# Patient Record
Sex: Female | Born: 1990 | Race: White | Hispanic: No | Marital: Single | Smoking: Current some day smoker
Health system: Southern US, Community
[De-identification: ages and names within clinical notes are randomized; demographics above are authoritative.]

## PROBLEM LIST (undated history)

## (undated) DIAGNOSIS — L709 Acne, unspecified: Secondary | ICD-10-CM

## (undated) DIAGNOSIS — N912 Amenorrhea, unspecified: Secondary | ICD-10-CM

## (undated) DIAGNOSIS — F329 Major depressive disorder, single episode, unspecified: Secondary | ICD-10-CM

## (undated) DIAGNOSIS — F988 Other specified behavioral and emotional disorders with onset usually occurring in childhood and adolescence: Secondary | ICD-10-CM

## (undated) DIAGNOSIS — F410 Panic disorder [episodic paroxysmal anxiety] without agoraphobia: Secondary | ICD-10-CM

## (undated) DIAGNOSIS — H9319 Tinnitus, unspecified ear: Secondary | ICD-10-CM

## (undated) DIAGNOSIS — T7840XA Allergy, unspecified, initial encounter: Secondary | ICD-10-CM

## (undated) DIAGNOSIS — L509 Urticaria, unspecified: Secondary | ICD-10-CM

## (undated) DIAGNOSIS — G4700 Insomnia, unspecified: Secondary | ICD-10-CM

## (undated) DIAGNOSIS — F419 Anxiety disorder, unspecified: Secondary | ICD-10-CM

## (undated) DIAGNOSIS — E349 Endocrine disorder, unspecified: Secondary | ICD-10-CM

## (undated) HISTORY — DX: Tinnitus, unspecified ear: H93.19

## (undated) HISTORY — DX: Acne, unspecified: L70.9

## (undated) HISTORY — DX: Insomnia, unspecified: G47.00

## (undated) HISTORY — DX: Allergy, unspecified, initial encounter: T78.40XA

## (undated) HISTORY — DX: Amenorrhea, unspecified: N91.2

## (undated) HISTORY — DX: Anxiety disorder, unspecified: F41.9

## (undated) HISTORY — DX: Endocrine disorder, unspecified: E34.9

## (undated) HISTORY — DX: Major depressive disorder, single episode, unspecified: F32.9

## (undated) HISTORY — DX: Other specified behavioral and emotional disorders with onset usually occurring in childhood and adolescence: F98.8

## (undated) HISTORY — DX: Urticaria, unspecified: L50.9

---
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
## 2011-04-24 ENCOUNTER — Ambulatory Visit: Payer: Self-pay | Admitting: Internal Medicine

## 2011-04-24 DIAGNOSIS — J019 Acute sinusitis, unspecified: Secondary | ICD-10-CM

## 2011-04-24 DIAGNOSIS — F988 Other specified behavioral and emotional disorders with onset usually occurring in childhood and adolescence: Secondary | ICD-10-CM

## 2011-05-29 ENCOUNTER — Encounter: Payer: Self-pay | Admitting: Internal Medicine

## 2011-05-29 ENCOUNTER — Ambulatory Visit: Payer: Self-pay | Admitting: Internal Medicine

## 2011-05-29 VITALS — BP 118/80 | HR 98 | Temp 97.3°F | Resp 16 | Ht 67.5 in | Wt 297.3 lb

## 2011-05-29 DIAGNOSIS — F988 Other specified behavioral and emotional disorders with onset usually occurring in childhood and adolescence: Secondary | ICD-10-CM

## 2011-05-29 MED ORDER — METHYLPHENIDATE HCL 20 MG PO TABS: 20.0000 mg | ORAL_TABLET | Freq: Two times a day (BID) | ORAL | Status: DC

## 2011-05-29 NOTE — Progress Notes (Signed)
  Subjective:    Patient ID: Samantha Hernandez, female    DOB: [birth year], 21 y.o.   MRN: [id number]  HPIAdderall worked fairly well at 10 mg and even better at 20 mg but both doses cause nausea for the duration and the activity of the medicine. This did not improve if taken with meals. There were no other side effects    Review of Systems     Objective:   Physical Exam Stable vital signs except obesity      Assessment & Plan:  Problem #1 ADD C. Referral by counselor McKaLovich  Shins she has no insurance our next option will be Ritalin 20 mg #60 to try one twice a day as needed She will call me the results and I'll arrange her next prescription by phone

## 2011-07-03 ENCOUNTER — Ambulatory Visit: Payer: Self-pay | Admitting: Internal Medicine

## 2011-07-15 ENCOUNTER — Telehealth: Payer: Self-pay

## 2011-07-15 DIAGNOSIS — F988 Other specified behavioral and emotional disorders with onset usually occurring in childhood and adolescence: Secondary | ICD-10-CM

## 2011-07-15 NOTE — Telephone Encounter (Signed)
PT REQUESTING RITALIN REFILL    BEST PHONE [phone]

## 2011-07-16 NOTE — Telephone Encounter (Signed)
TL- Please call the patient to clarify which dose she takes 10 mg or 20 mg?  Dr. Merla Riches- When do you want her to RTC for follow-up?

## 2011-07-17 NOTE — Telephone Encounter (Signed)
Patient takes 20 mg bid.

## 2011-07-17 NOTE — Telephone Encounter (Signed)
If you will clarify what does she is which I think is 20 mg of Ritalin twice a day and I will give her 3 months worth of prescriptions and see her again at the end of that

## 2011-07-19 DIAGNOSIS — F988 Other specified behavioral and emotional disorders with onset usually occurring in childhood and adolescence: Secondary | ICD-10-CM | POA: Insufficient documentation

## 2011-07-19 MED ORDER — METHYLPHENIDATE HCL 20 MG PO TABS: 20.0000 mg | ORAL_TABLET | Freq: Two times a day (BID) | ORAL | Status: DC

## 2011-07-19 NOTE — Telephone Encounter (Signed)
Okay as outlined in old chart

## 2011-07-20 NOTE — Telephone Encounter (Signed)
[facility] RX ready to pick up. 

## 2015-02-18 ENCOUNTER — Ambulatory Visit (INDEPENDENT_AMBULATORY_CARE_PROVIDER_SITE_OTHER): Payer: Commercial Managed Care - PPO | Admitting: Internal Medicine

## 2015-02-18 VITALS — BP 118/80 | HR 73 | Temp 98.2°F | Resp 18 | Ht 68.75 in | Wt 235.2 lb

## 2015-02-18 DIAGNOSIS — F909 Attention-deficit hyperactivity disorder, unspecified type: Secondary | ICD-10-CM | POA: Diagnosis not present

## 2015-02-18 DIAGNOSIS — F988 Other specified behavioral and emotional disorders with onset usually occurring in childhood and adolescence: Secondary | ICD-10-CM

## 2015-02-18 MED ORDER — AMPHETAMINE-DEXTROAMPHETAMINE 10 MG PO TABS: 10.0000 mg | ORAL_TABLET | Freq: Two times a day (BID) | ORAL | Status: DC

## 2015-02-18 MED ORDER — AMPHETAMINE-DEXTROAMPHETAMINE 10 MG PO TABS
10.0000 mg | ORAL_TABLET | Freq: Two times a day (BID) | ORAL | Status: DC
Start: 2015-02-18 — End: 2015-08-30

## 2015-02-18 NOTE — Progress Notes (Signed)
   Subjective:    Patient ID: Samantha Hernandez, female    DOB: 04/21/1990, 24 y.o.   MRN: [id number] This chart was scribed for Ellamae Siaobert Elliott Quade, MD by Jolene Provostobert Halas, Medical Scribe. This patient was seen in Room 3 and the patient's care was started a 3:31 PM.  Chief Complaint  Patient presents with  . ADD    Wants to get back on ADD medication.     HPI HPI Comments: Samantha Hernandez is a 24 y.o. female who [facility] complaining of attention difficulty, and difficulty making decisions. She has recently taken on a second job and is having trouble managing both. 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  [facility] the diagnosis of attention deficit disorder. She was treated successfully lost insurance and discontinued medication for the last few years.  Review of Systems  Constitutional: Negative for fever, chills and unexpected weight change.  Psychiatric/Behavioral: Positive for decreased concentration. Negative for behavioral problems. The patient is not nervous/anxious.    note that she has managed to lose 50 pounds through hard work    Objective:  BP 118/80 mmHg  Pulse 73  Temp(Src) 98.2 F (36.8 C) (Oral)  Resp 18  Ht 5' 8.75" (1.746 m)  Wt 235 lb 3.2 oz (106.686 kg)  BMI 35.00 kg/m2  SpO2 98%  LMP 02/04/2015  Physical Exam  Constitutional: She is oriented to person, place, and time. She appears well-developed and well-nourished. No distress.  HENT:  Head: Normocephalic and atraumatic.  Eyes: EOM are normal. Pupils are equal, round, and reactive to light.  Neck: Neck supple. No thyromegaly present.  Cardiovascular: Normal rate, regular rhythm and normal heart sounds.   No murmur heard. Pulmonary/Chest: Effort normal. No respiratory distress.  Musculoskeletal: Normal range of motion.  Neurological: She is alert and oriented to person, place, and time. No cranial nerve deficit.   Skin: Skin is warm and dry. She is not diaphoretic.  Psychiatric: She has a normal mood and affect. Her behavior is normal.  Nursing note and vitals reviewed.     Assessment & Plan:  ADD (attention deficit disorder)  Meds ordered this encounter  Medications  . amphetamine-dextroamphetamine (ADDERALL) 10 MG tablet    Sig: Take 1 tablet (10 mg total) by mouth 2 (two) times daily.    Dispense:  60 tablet    Refill:  0  . amphetamine-dextroamphetamine (ADDERALL) 10 MG tablet    Sig: Take 1 tablet (10 mg total) by mouth 2 (two) times daily. For 30 d after signed    Dispense:  60 tablet    Refill:  0  . amphetamine-dextroamphetamine (ADDERALL) 10 MG tablet    Sig: Take 1 tablet (10 mg total) by mouth 2 (two) times daily. For 60d after signed    Dispense:  60 tablet    Refill:  0  fu 3 mo  I have completed the patient encounter in its entirety as documented by the scribe, with editing by me where necessary. Davon Folta P. Merla Richesoolittle, M.D.   By signing my name below, I, Javier Dockerobert Ryan Halas, attest that this documentation has been prepared under the direction and in the presence of Ellamae Siaobert Carling Liberman, MD. Electronically Signed: Javier Dockerobert Ryan Halas, ER Scribe. 02/18/2015. 3:32 PM.

## 2015-07-13 ENCOUNTER — Ambulatory Visit (INDEPENDENT_AMBULATORY_CARE_PROVIDER_SITE_OTHER): Payer: Commercial Managed Care - PPO | Admitting: Physician Assistant

## 2015-07-13 VITALS — BP 122/80 | HR 77 | Temp 98.0°F | Resp 18 | Ht 68.75 in | Wt 197.4 lb

## 2015-07-13 DIAGNOSIS — H9313 Tinnitus, bilateral: Secondary | ICD-10-CM

## 2015-07-13 DIAGNOSIS — L853 Xerosis cutis: Secondary | ICD-10-CM

## 2015-07-13 DIAGNOSIS — H6123 Impacted cerumen, bilateral: Secondary | ICD-10-CM

## 2015-07-13 MED ORDER — FLUTICASONE PROPIONATE 50 MCG/ACT NA SUSP: 2.0000 | Freq: Every day | NASAL | Status: DC

## 2015-07-13 NOTE — Progress Notes (Signed)
[facility] [address], [place] [id number] [phone]  Date:  07/13/2015   Name:  Samantha CooleyKatie M Hernandez   DOB:  09/03/1990   MRN:  [id number]  PCP:  No primary care provider on file.    Chief Complaint: Cerumen Impaction and Dry Skin   History of Present Illness:  This is a 25 y.o. female with PMH ADD who is presenting with bilateral clogged ears. States she could tell they were getting clogged about a week ago. She bought debrox to clean them out and she thinks that actually made things worse. She has had some decreased hearing over the past 1 week.  Pt complains of bilateral tinnitus. Been going on for 5-6 years. When first started she went to an ENT. Hearing tests were normal. She has not been on any meds consistently that might cause. ENT thought it might be d/t jaw malalignment. She does endorse having allergies. Doesn't take anything for them  She is also complaining of dry skin on her face. She went to see a dermatologist who placed her on tretinoin topical and doxy oral. She states her skin has been very dry since starting this regimen. She states smiling hurts bc of how dry her skin is. She has been using neutrogena lotion twice a day but doesn't think that is helping. She has increased her hydration.  Review of Systems:  Review of Systems See HPI  Patient Active Problem List   Diagnosis Date Noted  . ADD (attention deficit disorder) 07/19/2011    Prior to Admission medications   Medication Sig Start Date End Date Taking? Authorizing Provider  amphetamine-dextroamphetamine (ADDERALL) 10 MG tablet Take 10 mg by mouth 2 (two) times daily.   Yes Historical Provider, MD  amphetamine-dextroamphetamine (ADDERALL) 10 MG tablet Take 1 tablet (10 mg total) by mouth 2 (two) times daily. 02/18/15  Yes Tonye Pearsonobert P Doolittle, MD  amphetamine-dextroamphetamine (ADDERALL) 10 MG tablet Take 1 tablet (10 mg total) by mouth 2 (two) times daily. For 30 d after signed 02/18/15   Yes Tonye Pearsonobert P Doolittle, MD  amphetamine-dextroamphetamine (ADDERALL) 10 MG tablet Take 1 tablet (10 mg total) by mouth 2 (two) times daily. For 60d after signed 02/18/15  Yes Tonye Pearsonobert P Doolittle, MD    No Known Allergies  History reviewed. No pertinent past surgical history.  Social History  Substance Use Topics  . Smoking status: Never Smoker   . Smokeless tobacco: None  . Alcohol Use: None    History reviewed. No pertinent family history.  Medication list has been reviewed and updated.  Physical Examination:  Physical Exam  Constitutional: She is oriented to person, place, and time. She appears well-developed and well-nourished. No distress.  HENT:  Head: Normocephalic and atraumatic.  Right Ear: Hearing, external ear and ear canal normal.  Left Ear: Hearing, external ear and ear canal normal.  Nose: Nose normal.  Bilateral TMs blocked by impacted cerumen After lavage, residual pieces of cerumen removed with alligator forceps. TMs clear.  Eyes: Conjunctivae and lids are normal. Right eye exhibits no discharge. Left eye exhibits no discharge. No scleral icterus.  Pulmonary/Chest: Effort normal. No respiratory distress.  Musculoskeletal: Normal range of motion.  Neurological: She is alert and oriented to person, place, and time.  Skin: Skin is warm, dry and intact.  Acne on chin, skin on chin and lower half of face very dry  Psychiatric: She has a normal mood and affect. Her speech is normal and behavior is normal.  Thought content normal.   BP 122/80 mmHg  Pulse 77  Temp(Src) 98 F (36.7 C) (Oral)  Resp 18  Ht 5' 8.75" (1.746 m)  Wt 197 lb 6.4 oz (89.54 kg)  BMI 29.37 kg/m2  SpO2 98%  LMP 06/22/2015  Assessment and Plan:  1. Tinnitus, bilateral Unknown cause at this time. Possible ETD? Try 1 month trial of flonase and see if helps. She has appt with oral surgeon to assess her jaw alignment. - fluticasone (FLONASE) 50 MCG/ACT nasal spray; Place 2 sprays into both  nostrils daily.  Dispense: 16 g; Refill: 12  2. Cerumen impaction, bilateral Symptoms resolved and canals clear with ear lavage.  3. Dry skin Advised she buy a neutrogena face cream instead of a lotion and apply BID. Counseled on adequate hydration.   Roswell Miners Dyke Brackett, MHS [facility]  07/13/2015

## 2015-07-13 NOTE — Patient Instructions (Addendum)
Try flonase, two sprays in each nostril once a day. Takes 2 weeks to reach maximal effectiveness - give it a month trial to see if helpful May use debrox, mineral oil or hydrogen peroxide once every 2 weeks to help with ear wax. Do not use q-tips EVER  neutrogena night cream twice a day.    IF you received an x-ray today, you will receive an invoice from [facility]. Please contact [facility] at [phone] with questions or concerns regarding your invoice.   IF you received labwork today, you will receive an invoice from [facility]. Please contact [facility] at [phone] with questions or concerns regarding your invoice.   Our billing staff will not be able to assist you with questions regarding bills from these companies.  You will be contacted with the lab results as soon as they are available. The fastest way to get your results is to activate your My Chart account. Instructions are located on the last page of this paperwork. If you have not heard from us regarding the results in 2 weeks, please contact this office.

## 2015-08-30 ENCOUNTER — Ambulatory Visit (INDEPENDENT_AMBULATORY_CARE_PROVIDER_SITE_OTHER): Payer: Commercial Managed Care - PPO | Admitting: Internal Medicine

## 2015-08-30 VITALS — BP 100/68 | HR 92 | Temp 98.3°F | Resp 16 | Ht 67.25 in | Wt 183.0 lb

## 2015-08-30 DIAGNOSIS — G4700 Insomnia, unspecified: Secondary | ICD-10-CM

## 2015-08-30 DIAGNOSIS — R1013 Epigastric pain: Secondary | ICD-10-CM

## 2015-08-30 DIAGNOSIS — R5383 Other fatigue: Secondary | ICD-10-CM | POA: Diagnosis not present

## 2015-08-30 DIAGNOSIS — R634 Abnormal weight loss: Secondary | ICD-10-CM | POA: Diagnosis not present

## 2015-08-30 DIAGNOSIS — F419 Anxiety disorder, unspecified: Secondary | ICD-10-CM | POA: Diagnosis not present

## 2015-08-30 DIAGNOSIS — F329 Major depressive disorder, single episode, unspecified: Secondary | ICD-10-CM

## 2015-08-30 MED ORDER — BUPROPION HCL ER (XL) 150 MG PO TB24: 150.0000 mg | ORAL_TABLET | Freq: Every day | ORAL | Status: DC

## 2015-08-30 NOTE — Patient Instructions (Signed)
     IF you received an x-ray today, you will receive an invoice from [facility]. Please contact  Radiology at [phone] with questions or concerns regarding your invoice.   IF you received labwork today, you will receive an invoice from [facility]. Please contact [facility] at [phone] with questions or concerns regarding your invoice.   Our billing staff will not be able to assist you with questions regarding bills from these companies.  You will be contacted with the lab results as soon as they are available. The fastest way to get your results is to activate your My Chart account. Instructions are located on the last page of this paperwork. If you have not heard from us regarding the results in 2 weeks, please contact this office.      

## 2015-08-30 NOTE — Progress Notes (Signed)
By signing my name below, I, Mesha Guinyard, attest that this documentation has been prepared under the direction and in the presence of Ellamae Sia, MD.  Electronically Signed: Arvilla Market, Medical Scribe. 08/30/2015. 5:39 PM.  Subjective:    Patient ID: Samantha Hernandez, female    DOB: 1990-11-01, 25 y.o.   MRN: [id number]  HPI Chief Complaint  Patient presents with  . stomach burning    started 4-5 months, "worse" in the last 1 1/2 wks  . bloating  . no fatigue    HPI Comments: Samantha Hernandez is a 25 y.o. female who [facility] complaining of digestive problems. Pt reports she has always had digestive problems. Pt reports her stomach is tight and on fire when she eats. Pt mentions she's bloated all the time and occasionally wakes up from it. Pt reports eating makes her nauseous. Pt has associated symptoms of constipation. Pt reports HA, and palpitation that subsides after a few seconds. Pt was using Betaine HCL supplements for relief of her symptoms and it has helped with her digestive problems and acne. When she ran out for a week, her symptoms got worse again. Pt mentions theres now a raw spot inside of her stomach once she stopped taking the supplements. Pt has not had any surgery. Pts LMP has been a month ago and denies possibility of being pregnant. Pt denies diarrhea.  Hormones: Pt is nervous about having irregular hormone levels. Pt reports concern for liver problems because she's been told multiple times that her skin has a yellowish tint to it. Acne recent exacerb.  Neck: Pt reports waxing and waining neck pain located in the back of her neck.  Pt is trying to get out of her current job but wants to get hr doctors appointment before she quits. Pt states that her parents stress her out and she doesn't wan tot live with her mom any more. Pt feels trapped when she lives with her mom. Pt states that her mom doesn't care about anything but herself.  She states that other people in her family has always seen that. Pt felt like she was always a docile child and now that she lives at the house more things was brought to the light. Her dad is "chill". Pt gained weight in the 5th grade. Pt was nervous about people picking on her although it never happened. Pt always doubted that she was smarter than everyone else. She's trying to find a new place to live but she would like to save up some money first. Pt states she quit her job at [employer]. Pt states that she doesn't do anything for fun because she always works. Pt thinks she matured earlier than most and wants to know how she can get her life on track now. Pt feels like she got to that point a few years ago, but know she doesn't know. Pt has been having anxiety attacks and she had 3 a month ago because she had to have jaw surgery. Pt thinks it changed something in her brain after that 10 min period because she had a short temper with people. Pt feels like there is a weight on her for months and slows her down at work. Pt stopped taking adderall. Pt would like to get referrals to a new doctor. Pt has had a problem with sleeping since she was 25 y/o. Pt mentions when she was working night shifts at [employer] she would get home and  couldn't go to sleep. Pt doesn't want to go on sleep medication. Pt feels like she doesn't need help focusing, but she needs help on how to destressed.  Mom with obsessive anxiety--afraid to get out!  Patient Active Problem List   Diagnosis Date Noted  . ADD (attention deficit disorder) 07/19/2011   No past medical history on file. No past surgical history on file. No Known Allergies Prior to Admission medications   Medication Sig Start Date End Date Taking? Authorizing Provider  Digestive Enzymes (BETAINE HCL PO) Take by mouth.   Yes Historical Provider, MD  fluticasone (FLONASE) 50 MCG/ACT nasal spray Place 2 sprays into both nostrils daily. Patient not taking: Reported on  08/30/2015 07/13/15   Overton MamNicole Bush V, PA-C   Social History   Social History  . Marital Status: Single    Spouse Name: N/A  . Number of Children: N/A  . Years of Education: N/A   Occupational History  . Not on file.   Social History Main Topics  . Smoking status: Never Smoker   . Smokeless tobacco: Not on file  . Alcohol Use: Not on file  . Drug Use: Not on file  . Sexual Activity: Not on file   Other Topics Concern  . Not on file   Social History Narrative   Review of Systems  Constitutional: Positive for appetite change and fatigue.  Cardiovascular: Positive for palpitations.  Gastrointestinal: Positive for nausea, abdominal pain and constipation. Negative for diarrhea.  Musculoskeletal: Positive for neck pain.  Skin: Positive for color change (slight yellow tint).  Neurological: Positive for headaches.  Psychiatric/Behavioral: Positive for sleep disturbance. Negative for decreased concentration.   Objective:  BP 100/68 mmHg  Pulse 92  Temp(Src) 98.3 F (36.8 C) (Oral)  Resp 16  Ht 5' 7.25" (1.708 m)  Wt 183 lb (83.008 kg)  BMI 28.45 kg/m2  SpO2 98%  LMP 07/30/2015  Wt Readings from Last 3 Encounters:  08/30/15 183 lb (83.008 kg)  07/13/15 197 lb 6.4 oz (89.54 kg)  02/18/15 235 lb 3.2 oz (106.686 kg)  was close to 300 at max   Physical Exam  Constitutional: She is oriented to person, place, and time. She appears well-developed and well-nourished. No distress.  HENT:  Head: Normocephalic and atraumatic.  Mouth/Throat: Oropharynx is clear and moist.  Eyes: Conjunctivae and EOM are normal. Pupils are equal, round, and reactive to light.  Neck: Normal range of motion. Neck supple. No thyromegaly present.  Cardiovascular: Normal rate, regular rhythm, normal heart sounds and intact distal pulses.   No murmur heard. Pulmonary/Chest: Effort normal and breath sounds normal.  Abdominal: Soft. Bowel sounds are normal. She exhibits no distension and no mass. There is  no tenderness. There is no rebound and no guarding.  Musculoskeletal: She exhibits no edema.  Lymphadenopathy:    She has no cervical adenopathy.  Neurological: She is alert and oriented to person, place, and time. No cranial nerve deficit.  Skin: Skin is warm and dry.  Psychiatric: She has a normal mood and affect. Her behavior is normal. Judgment and thought content normal.  Nursing note and vitals reviewed.   Assessment & Plan:    Fatigue-?metabolic vs depr  Loss of weight - Plan: CBC with Differential/Platelet, Comprehensive metabolic panel, TSH  Dyspepsia - Plan: H. pylori breath test  Postprandial epigastric pain  Anxiety Insomnia Reactive depression  ref to counseling Meds ordered this encounter  Medications  . buPROPion (WELLBUTRIN XL) 150 MG 24 hr tablet  Sig: Take 1 tablet (150 mg total) by mouth daily. On day 5 increase to 2 tabs daily    Dispense:  60 tablet    Refill:  0  Will f/u 10-14d after labs direct to next step    I have completed the patient encounter in its entirety as documented by the scribe, with editing by me where necessary. Robert P. Merla Riches, M.D.

## 2015-08-31 LAB — COMPREHENSIVE METABOLIC PANEL
ALBUMIN: 4.4 g/dL (ref 3.6–5.1)
ALT: 17 U/L (ref 6–29)
AST: 17 U/L (ref 10–30)
Alkaline Phosphatase: 71 U/L (ref 33–115)
BILIRUBIN TOTAL: 0.6 mg/dL (ref 0.2–1.2)
BUN: 18 mg/dL (ref 7–25)
CO2: 24 mmol/L (ref 20–31)
CREATININE: 0.73 mg/dL (ref 0.50–1.10)
Calcium: 9.4 mg/dL (ref 8.6–10.2)
Chloride: 105 mmol/L (ref 98–110)
Glucose, Bld: 76 mg/dL (ref 65–99)
Potassium: 4.2 mmol/L (ref 3.5–5.3)
SODIUM: 141 mmol/L (ref 135–146)
TOTAL PROTEIN: 6.7 g/dL (ref 6.1–8.1)

## 2015-08-31 LAB — CBC WITH DIFFERENTIAL/PLATELET
BASOS ABS: 53 {cells}/uL (ref 0–200)
BASOS PCT: 1 %
EOS PCT: 6 %
Eosinophils Absolute: 318 cells/uL (ref 15–500)
HCT: 40.3 % (ref 35.0–45.0)
HEMOGLOBIN: 13.5 g/dL (ref 11.7–15.5)
Lymphocytes Relative: 29 %
Lymphs Abs: 1537 cells/uL (ref 850–3900)
MCH: 32.1 pg (ref 27.0–33.0)
MCHC: 33.5 g/dL (ref 32.0–36.0)
MCV: 95.7 fL (ref 80.0–100.0)
MONO ABS: 318 {cells}/uL (ref 200–950)
MONOS PCT: 6 %
MPV: 10.7 fL (ref 7.5–12.5)
NEUTROS ABS: 3074 {cells}/uL (ref 1500–7800)
Neutrophils Relative %: 58 %
PLATELETS: 241 10*3/uL (ref 140–400)
RBC: 4.21 MIL/uL (ref 3.80–5.10)
RDW: 13.4 % (ref 11.0–15.0)
WBC: 5.3 10*3/uL (ref 3.8–10.8)

## 2015-08-31 LAB — TSH: TSH: 0.84 mIU/L

## 2015-09-01 LAB — H. PYLORI BREATH TEST: H. pylori Breath Test: NOT DETECTED

## 2015-09-06 ENCOUNTER — Telehealth: Payer: Self-pay

## 2015-09-06 DIAGNOSIS — K209 Esophagitis, unspecified without bleeding: Secondary | ICD-10-CM

## 2015-09-06 NOTE — Telephone Encounter (Signed)
Dr Merla Richesoolittle is requesting a referral to gastroenterology for the patient, based on the note below from the lab results.  Please place a referral for the patient.  Thank you.  Notes Recorded by Tonye Pearsonobert P Doolittle, MD on 09/04/2015 at 3:02 PM h pylori negative so next step is gi eval for possible endoscopy Ref to [place] for esophagitis

## 2015-09-07 NOTE — Telephone Encounter (Signed)
Done

## 2015-09-08 ENCOUNTER — Encounter: Payer: Self-pay | Admitting: Internal Medicine

## 2015-09-23 ENCOUNTER — Encounter (HOSPITAL_BASED_OUTPATIENT_CLINIC_OR_DEPARTMENT_OTHER): Payer: Self-pay | Admitting: *Deleted

## 2015-09-23 DIAGNOSIS — R197 Diarrhea, unspecified: Secondary | ICD-10-CM | POA: Insufficient documentation

## 2015-09-23 DIAGNOSIS — Z79899 Other long term (current) drug therapy: Secondary | ICD-10-CM | POA: Insufficient documentation

## 2015-09-23 DIAGNOSIS — R1031 Right lower quadrant pain: Secondary | ICD-10-CM | POA: Insufficient documentation

## 2015-09-23 DIAGNOSIS — K5900 Constipation, unspecified: Secondary | ICD-10-CM | POA: Insufficient documentation

## 2015-09-23 NOTE — ED Notes (Signed)
Per pt report ongoing SOB at times, feeling winded, and rt side flank pain that cause spasms. When eating food there is a burning sensation afterwards.

## 2015-09-24 ENCOUNTER — Emergency Department (HOSPITAL_BASED_OUTPATIENT_CLINIC_OR_DEPARTMENT_OTHER): Payer: Commercial Managed Care - PPO

## 2015-09-24 ENCOUNTER — Encounter (HOSPITAL_BASED_OUTPATIENT_CLINIC_OR_DEPARTMENT_OTHER): Payer: Self-pay | Admitting: Emergency Medicine

## 2015-09-24 ENCOUNTER — Emergency Department (HOSPITAL_BASED_OUTPATIENT_CLINIC_OR_DEPARTMENT_OTHER)
Admission: EM | Admit: 2015-09-24 | Discharge: 2015-09-24 | Disposition: A | Discharge status: Home / Self Care | Payer: Commercial Managed Care - PPO | Attending: Emergency Medicine | Admitting: Emergency Medicine

## 2015-09-24 DIAGNOSIS — R52 Pain, unspecified: Secondary | ICD-10-CM

## 2015-09-24 DIAGNOSIS — R109 Unspecified abdominal pain: Secondary | ICD-10-CM

## 2015-09-24 LAB — COMPREHENSIVE METABOLIC PANEL
ALBUMIN: 4.1 g/dL (ref 3.5–5.0)
ALK PHOS: 57 U/L (ref 38–126)
ALT: 42 U/L (ref 14–54)
AST: 32 U/L (ref 15–41)
Anion gap: 7 (ref 5–15)
BUN: 11 mg/dL (ref 6–20)
CALCIUM: 9.1 mg/dL (ref 8.9–10.3)
CHLORIDE: 102 mmol/L (ref 101–111)
CO2: 27 mmol/L (ref 22–32)
CREATININE: 0.8 mg/dL (ref 0.44–1.00)
GFR calc non Af Amer: >60 mL/min (ref >60)
GLUCOSE: 101 mg/dL — AB (ref 65–99)
Potassium: 3.5 mmol/L (ref 3.5–5.1)
SODIUM: 136 mmol/L (ref 135–145)
Total Bilirubin: 0.6 mg/dL (ref 0.3–1.2)
Total Protein: 6.9 g/dL (ref 6.5–8.1)

## 2015-09-24 LAB — CBC WITH DIFFERENTIAL/PLATELET
Basophils Absolute: 0 10*3/uL (ref 0.0–0.1)
Basophils Relative: 0 %
Eosinophils Absolute: 0.4 10*3/uL (ref 0.0–0.7)
Eosinophils Relative: 8 %
HCT: 36 % (ref 36.0–46.0)
Hemoglobin: 12.2 g/dL (ref 12.0–15.0)
Lymphocytes Relative: 39 %
Lymphs Abs: 2 10*3/uL (ref 0.7–4.0)
MCH: 33 pg (ref 26.0–34.0)
MCHC: 33.9 g/dL (ref 30.0–36.0)
MCV: 97.3 fL (ref 78.0–100.0)
Monocytes Absolute: 0.4 10*3/uL (ref 0.1–1.0)
Monocytes Relative: 7 %
Neutro Abs: 2.4 10*3/uL (ref 1.7–7.7)
Neutrophils Relative %: 46 %
Platelets: 198 10*3/uL (ref 150–400)
RBC: 3.7 MIL/uL — ABNORMAL LOW (ref 3.87–5.11)
RDW: 12.8 % (ref 11.5–15.5)
WBC: 5.3 10*3/uL (ref 4.0–10.5)

## 2015-09-24 LAB — URINALYSIS, ROUTINE W REFLEX MICROSCOPIC
Bilirubin Urine: NEGATIVE
GLUCOSE, UA: NEGATIVE mg/dL
Ketones, ur: NEGATIVE mg/dL
Leukocytes, UA: NEGATIVE
Nitrite: NEGATIVE
PH: 7.5 (ref 5.0–8.0)
Protein, ur: NEGATIVE mg/dL
SPECIFIC GRAVITY, URINE: 1.005 (ref 1.005–1.030)

## 2015-09-24 LAB — LIPASE, BLOOD: Lipase: 36 U/L (ref 11–51)

## 2015-09-24 LAB — URINE MICROSCOPIC-ADD ON

## 2015-09-24 LAB — PREGNANCY, URINE: PREG TEST UR: NEGATIVE

## 2015-09-24 MED ORDER — DICYCLOMINE HCL 10 MG/ML IM SOLN
20.0000 mg | Freq: Once | INTRAMUSCULAR | Status: AC
  Administered 2015-09-24: 20 mg via INTRAMUSCULAR
  Filled 2015-09-24: qty 2

## 2015-09-24 MED ORDER — OMEPRAZOLE 20 MG PO CPDR: 20.0000 mg | DELAYED_RELEASE_CAPSULE | Freq: Every day | ORAL | Status: DC

## 2015-09-24 MED ORDER — GI COCKTAIL ~~LOC~~
30.0000 mL | Freq: Once | ORAL | Status: AC
  Administered 2015-09-24: 30 mL via ORAL
  Filled 2015-09-24: qty 30

## 2015-09-24 MED ORDER — DICYCLOMINE HCL 20 MG PO TABS: 20.0000 mg | ORAL_TABLET | Freq: Two times a day (BID) | ORAL | Status: DC

## 2015-09-24 NOTE — ED Notes (Signed)
MD at bedside. 

## 2015-09-24 NOTE — ED Notes (Signed)
Dr. Nicanor AlconPalumbo [facility]

## 2015-09-24 NOTE — ED Provider Notes (Signed)
CSN: [id number]     Arrival date & time 09/23/15  2115 History  By signing my name below, I, Samantha Hernandez, attest that this documentation has been prepared under the direction and in the presence of April Palumbo, MD . Electronically Signed: Rowan Hernandez, Scribe. 09/24/2015. 1:00 AM.   Chief Complaint  Patient presents with  . Shortness of Breath  . Flank Pain    Patient is a 25 y.o. female presenting with abdominal pain. The history is provided by the patient. No language interpreter was used.  Abdominal Pain Pain location:  R flank, RLQ and RUQ Pain quality: burning   Pain radiates to:  Does not radiate Pain severity:  Moderate Onset quality:  Gradual Duration:  8 weeks Timing:  Constant Progression:  Waxing and waning Chronicity:  New Context: not alcohol use, not medication withdrawal and not retching   Relieved by:  Nothing Worsened by:  Nothing tried Ineffective treatments:  None tried Associated symptoms: constipation and diarrhea   Associated symptoms: no chills, no dysuria, no fever, no hematuria, no shortness of breath, no sore throat and no vomiting   Risk factors: no alcohol abuse and has not had multiple surgeries    HPI Comments:  Samantha Hernandez is a 25 y.o. female who presents to the Emergency Department complaining of intermittent burning right side abdominal pain for the past 2 months, worsening in the past few days. Pain worsens after eating. Pt reports associated intermittent diarrhea and constipation, with 1 BM per day. She eats rice, beans, soup and smoothies; she has not been eating meat, dairy or sugar for the past 6 months. No alleviating factors noted or treatments attempted PTA. Pt has upcoming appointment with GI. Denies vomiting, dysruria, difficulty starting or stopping urine stream, or hematuria.  Per chart review, pt has been seen for similar symptoms since 2015.  History reviewed. No pertinent past medical history. History reviewed. No pertinent  past surgical history. History reviewed. No pertinent family history. Social History  Substance Use Topics  . Smoking status: Never Smoker   . Smokeless tobacco: None  . Alcohol Use: No   OB History    No data available     Review of Systems  Constitutional: Negative for fever and chills.  HENT: Negative for sore throat.   Respiratory: Negative for shortness of breath.   Gastrointestinal: Positive for abdominal pain, diarrhea and constipation. Negative for vomiting.  Genitourinary: Negative for dysuria and hematuria.  All other systems reviewed and are negative.   Allergies  Review of patient's allergies indicates no known allergies.  Home Medications   Prior to Admission medications   Medication Sig Start Date End Date Taking? Authorizing Provider  norethindrone-ethinyl estradiol-iron (ESTROSTEP FE,TILIA FE,TRI-LEGEST FE) 1-20/1-30/1-35 MG-MCG tablet Take 1 tablet by mouth daily.   Yes Historical Provider, MD   BP 106/49 mmHg  Pulse 61  Temp(Src) 98.4 F (36.9 C) (Oral)  Resp 18  Ht 5' 7" (1.702 m)  Wt 183 lb (83.008 kg)  BMI 28.66 kg/m2  SpO2 100%  LMP 07/29/2015 (Exact Date)   Physical Exam  Constitutional: She is oriented to person, place, and time. She appears well-developed and well-nourished.  HENT:  Head: Normocephalic and atraumatic.  Mouth/Throat: Oropharynx is clear and moist. No oropharyngeal exudate.  Eyes: Pupils are equal, round, and reactive to light.  Neck: Neck supple.  Cardiovascular: Normal rate and regular rhythm.   Pulmonary/Chest: Effort normal and breath sounds normal. No respiratory distress. She has no wheezes. She has  no rales.  Abdominal: Soft. She exhibits no mass. Bowel sounds are increased. There is no tenderness. There is no rigidity, no rebound, no guarding, no tenderness at McBurney's point and negative Murphy's sign.  Musculoskeletal: Normal range of motion.  Neurological: She is alert and oriented to person, place, and time. She  has normal reflexes.  Skin: Skin is warm and dry.  Psychiatric: She has a normal mood and affect.  Nursing note and vitals reviewed.   ED Course  Procedures  DIAGNOSTIC STUDIES:  Oxygen Saturation is 100% on RA, normal by my interpretation.    COORDINATION OF CARE:  12:58 AM Will order blood work and UA. Discussed treatment plan with pt at bedside and pt agreed to plan.  Labs Review Labs Reviewed - No data to display  Imaging Review No results found. I have personally reviewed and evaluated these images and lab results as part of my medical decision-making.   EKG Interpretation None      MDM   Final diagnoses:  None   Filed Vitals:   09/23/15 2123 09/24/15 0019  BP: 113/69 106/49  Pulse: 73 61  Temp: 98.4 F (36.9 C)   Resp: 18 18   Results for orders placed or performed in visit on 08/30/15  CBC with Differential/Platelet  Result Value Ref Range   WBC 5.3 3.8 - 10.8 K/uL   RBC 4.21 3.80 - 5.10 MIL/uL   Hemoglobin 13.5 11.7 - 15.5 g/dL   HCT 40.3 35.0 - 45.0 %   MCV 95.7 80.0 - 100.0 fL   MCH 32.1 27.0 - 33.0 pg   MCHC 33.5 32.0 - 36.0 g/dL   RDW 13.4 11.0 - 15.0 %   Platelets 241 140 - 400 K/uL   MPV 10.7 7.5 - 12.5 fL   Neutro Abs 3074 1500 - 7800 cells/uL   Lymphs Abs 1537 850 - 3900 cells/uL   Monocytes Absolute 318 200 - 950 cells/uL   Eosinophils Absolute 318 15 - 500 cells/uL   Basophils Absolute 53 0 - 200 cells/uL   Neutrophils Relative % 58 %   Lymphocytes Relative 29 %   Monocytes Relative 6 %   Eosinophils Relative 6 %   Basophils Relative 1 %   Smear Review Criteria for review not met   Comprehensive metabolic panel  Result Value Ref Range   Sodium 141 135 - 146 mmol/L   Potassium 4.2 3.5 - 5.3 mmol/L   Chloride 105 98 - 110 mmol/L   CO2 24 20 - 31 mmol/L   Glucose, Bld 76 65 - 99 mg/dL   BUN 18 7 - 25 mg/dL   Creat 0.73 0.50 - 1.10 mg/dL   Total Bilirubin 0.6 0.2 - 1.2 mg/dL   Alkaline Phosphatase 71 33 - 115 U/L   AST 17 10 -  30 U/L   ALT 17 6 - 29 U/L   Total Protein 6.7 6.1 - 8.1 g/dL   Albumin 4.4 3.6 - 5.1 g/dL   Calcium 9.4 8.6 - 10.2 mg/dL  H. pylori breath test  Result Value Ref Range   H. pylori Breath Test NOT DETECTED Not Detected  TSH  Result Value Ref Range   TSH 0.84 mIU/L   No results found. Results for orders placed or performed during the hospital encounter of 09/24/15  CBC with Differential/Platelet  Result Value Ref Range   WBC 5.3 4.0 - 10.5 K/uL   RBC 3.70 (L) 3.87 - 5.11 MIL/uL   Hemoglobin 12.2 12.0 - 15.0  g/dL   HCT 36.0 36.0 - 46.0 %   MCV 97.3 78.0 - 100.0 fL   MCH 33.0 26.0 - 34.0 pg   MCHC 33.9 30.0 - 36.0 g/dL   RDW 12.8 11.5 - 15.5 %   Platelets 198 150 - 400 K/uL   Neutrophils Relative % 46 %   Neutro Abs 2.4 1.7 - 7.7 K/uL   Lymphocytes Relative 39 %   Lymphs Abs 2.0 0.7 - 4.0 K/uL   Monocytes Relative 7 %   Monocytes Absolute 0.4 0.1 - 1.0 K/uL   Eosinophils Relative 8 %   Eosinophils Absolute 0.4 0.0 - 0.7 K/uL   Basophils Relative 0 %   Basophils Absolute 0.0 0.0 - 0.1 K/uL  Comprehensive metabolic panel  Result Value Ref Range   Sodium 136 135 - 145 mmol/L   Potassium 3.5 3.5 - 5.1 mmol/L   Chloride 102 101 - 111 mmol/L   CO2 27 22 - 32 mmol/L   Glucose, Bld 101 (H) 65 - 99 mg/dL   BUN 11 6 - 20 mg/dL   Creatinine, Ser 0.80 0.44 - 1.00 mg/dL   Calcium 9.1 8.9 - 10.3 mg/dL   Total Protein 6.9 6.5 - 8.1 g/dL   Albumin 4.1 3.5 - 5.0 g/dL   AST 32 15 - 41 U/L   ALT 42 14 - 54 U/L   Alkaline Phosphatase 57 38 - 126 U/L   Total Bilirubin 0.6 0.3 - 1.2 mg/dL   GFR calc non Af Amer >60 >60 mL/min   GFR calc Af Amer >60 >60 mL/min   Anion gap 7 5 - 15  Lipase, blood  Result Value Ref Range   Lipase 36 11 - 51 U/L  Urinalysis, Routine w reflex microscopic (not at [facility])  Result Value Ref Range   Color, Urine YELLOW YELLOW   APPearance CLEAR CLEAR   Specific Gravity, Urine 1.005 1.005 - 1.030   pH 7.5 5.0 - 8.0   Glucose, UA NEGATIVE NEGATIVE mg/dL    Hgb urine dipstick LARGE (A) NEGATIVE   Bilirubin Urine NEGATIVE NEGATIVE   Ketones, ur NEGATIVE NEGATIVE mg/dL   Protein, ur NEGATIVE NEGATIVE mg/dL   Nitrite NEGATIVE NEGATIVE   Leukocytes, UA NEGATIVE NEGATIVE  Pregnancy, urine  Result Value Ref Range   Preg Test, Ur NEGATIVE NEGATIVE  Urine microscopic-add on  Result Value Ref Range   Squamous Epithelial / LPF 0-5 (A) NONE SEEN   WBC, UA 0-5 0 - 5 WBC/hpf   RBC / HPF 6-30 0 - 5 RBC/hpf   Bacteria, UA RARE (A) NONE SEEN   Ct Renal Stone Study  09/24/2015  CLINICAL DATA:  Subacute onset of lower back pain and hematuria. Initial encounter. EXAM: CT ABDOMEN AND PELVIS WITHOUT CONTRAST TECHNIQUE: Multidetector CT imaging of the abdomen and pelvis was performed following the standard protocol without IV contrast. COMPARISON:  None. FINDINGS: The visualized lung bases are clear. The liver and spleen are unremarkable in appearance. The gallbladder is within normal limits. The pancreas and adrenal glands are unremarkable. The kidneys are unremarkable in appearance. There is no evidence of hydronephrosis. No renal or ureteral stones are seen. No perinephric stranding is appreciated. No free fluid is identified. The small bowel is unremarkable in appearance. The stomach is within normal limits. No acute vascular abnormalities are seen. The appendix is normal in caliber, without evidence of appendicitis. The colon is grossly unremarkable in appearance. The bladder is mildly distended and grossly unremarkable. The uterus is unremarkable in  appearance. The ovaries are relatively symmetric. No suspicious adnexal masses are seen. No inguinal lymphadenopathy is seen. No acute osseous abnormalities are identified. IMPRESSION: Unremarkable noncontrast CT of the abdomen and pelvis. Electronically Signed   By: Garald Balding M.D.   On: 09/24/2015 02:28    Medications  dicyclomine (BENTYL) injection 20 mg (20 mg Intramuscular Given 09/24/15 0209)  gi cocktail  (Maalox,Lidocaine,Donnatal) (30 mLs Oral Given 09/24/15 0208)    Well appearing exam and vitals are benign and reassuring as are labs and imaging.  Has had H Pylori as an outpatient which was negative.  Is scheduled to be seen by GI as an outpatient.  I suspect this is GERD or diet related.  I agree that she should be seen by GI in follow up.   She is feeling better post medication. I have recommended no  Beans or gas causing foods as well as prilosec and bentyl.   Strict return precautions given   I personally performed the services described in this documentation, which was scribed in my presence. The recorded information has been reviewed and is accurate.      Veatrice Kells, MD 09/24/15 [phone]

## 2015-10-12 ENCOUNTER — Encounter: Payer: Self-pay | Admitting: Internal Medicine

## 2015-10-13 ENCOUNTER — Ambulatory Visit: Payer: Self-pay | Admitting: Internal Medicine

## 2015-12-25 ENCOUNTER — Ambulatory Visit: Payer: Commercial Managed Care - PPO | Admitting: Internal Medicine

## 2015-12-28 ENCOUNTER — Encounter: Payer: Self-pay | Admitting: Diagnostic Neuroimaging

## 2017-01-04 ENCOUNTER — Ambulatory Visit: Payer: Commercial Managed Care - PPO | Admitting: Physician Assistant

## 2017-01-13 ENCOUNTER — Ambulatory Visit (INDEPENDENT_AMBULATORY_CARE_PROVIDER_SITE_OTHER): Payer: BLUE CROSS/BLUE SHIELD | Admitting: Physician Assistant

## 2017-01-13 ENCOUNTER — Encounter: Payer: Self-pay | Admitting: Physician Assistant

## 2017-01-13 VITALS — BP 116/72 | HR 92 | Temp 98.9°F | Resp 18 | Ht 67.0 in | Wt 179.0 lb

## 2017-01-13 DIAGNOSIS — Z13220 Encounter for screening for lipoid disorders: Secondary | ICD-10-CM

## 2017-01-13 DIAGNOSIS — F411 Generalized anxiety disorder: Secondary | ICD-10-CM | POA: Diagnosis not present

## 2017-01-13 DIAGNOSIS — L700 Acne vulgaris: Secondary | ICD-10-CM

## 2017-01-13 MED ORDER — ESCITALOPRAM OXALATE 10 MG PO TABS: 10.0000 mg | ORAL_TABLET | Freq: Every day | ORAL | 1 refills | Status: DC

## 2017-01-13 MED ORDER — CEPHALEXIN 500 MG PO CAPS: 500.0000 mg | ORAL_CAPSULE | Freq: Three times a day (TID) | ORAL | 0 refills | Status: DC

## 2017-01-13 MED ORDER — CLINDAMYCIN PHOSPHATE 1 % EX GEL: Freq: Two times a day (BID) | CUTANEOUS | 0 refills | Status: DC

## 2017-01-13 NOTE — Progress Notes (Signed)
Samantha Hernandez  MRN: [id number] DOB: 03-18-1991  PCP: Mancel Bale, PA-C  Chief Complaint  Patient presents with  . Establish Care    talk about medication   . Depression    screening was a 18     Subjective:  Pt presents to clinic for concerns about acne and anxiety.  Anxiety - did not know what it was for a long time but now along with therapy she realizes that she thinks she has had it a long time - she felt like it was manageable but then 3 years ago it got worse - she got out of school and went to work for 2 jobs - she was put on Adderall and it helped but things never really got better - then she quit a job and she felt like she no longer needed it -- after she stopped the job that was causing a lot of stress she got emotionally numb for a few months - she felt like it might have have been bad job situation - now she had a good job that she likes and then she restart Adderall and felt like it worked good - during that time increase in anxiety and then she started to withdraw from people --    She feels like she has young people worries but at the time she felt like she was a burden which increased her anxiety - she had a panic attack about 1 year ago and did not even know what it was at the time (she could not breath at the time and went to the ED for medical care) - this caused medical debt.  She started with trying to deal with her anxiety - she feels like it is a loop - no control over her stress and it is controlling her life - she is seeing a therapist - good at identifying her thought patterns -  Very anxious about her acne - spends a lot of time with her acne -- it is anxiety provoking for her and she also feels like it gets worse when she is anxious.  She has had keflex before and that really helped with her acne.  She has tried benzoyl peroxide but it is really drying to her face - she has been put on retin a but she could not tolerate it from the dryness aspect.  She  currently feels like she has some focus problems but feels like it is anxiety related.  [facility] at [facility] is her therapist. Sees him weekly for her anxiety.  History is obtained by patient.  Review of Systems  Constitutional: Negative for chills and fever.  Psychiatric/Behavioral: Positive for decreased concentration and dysphoric mood (seems related to anxiety). The patient is nervous/anxious.     Patient Active Problem List   Diagnosis Date Noted  . ADD (attention deficit disorder) 07/19/2011    No current outpatient prescriptions on file prior to visit.   No current facility-administered medications on file prior to visit.     No Known Allergies  Past Medical History:  Diagnosis Date  . Acne   . ADD (attention deficit disorder)   . Anxiety   . Insomnia   . Reactive depression   . 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 Smoking status: Never Smoker  . Smokeless tobacco: Never Used  . Alcohol use No   family history includes Anxiety disorder in her mother and sister; Bipolar disorder in her cousin and paternal aunt; Depression in her father.     Objective:  BP 116/72   Pulse 92   Temp 98.9 F (37.2 C) (Oral)   Resp 18   Ht '5\' 7"'  (1.702 m)   Wt 179 lb (81.2 kg)   SpO2 98%   BMI 28.04 kg/m  Body mass index is 28.04 kg/m.  Physical Exam  Constitutional: She is oriented to person, place, and time and well-developed, well-nourished, and in no distress.  HENT:  Head: Normocephalic and atraumatic.  Right Ear: Hearing and external ear normal.  Left Ear: Hearing and external ear normal.  Eyes: Conjunctivae are normal.  Neck: Normal range of motion.  Cardiovascular: Normal rate, regular rhythm and normal heart sounds.   No murmur heard. Pulmonary/Chest:  Effort normal and breath sounds normal. She has no wheezes.  Neurological: She is alert and oriented to person, place, and time. Gait normal.  Skin: Skin is warm and dry.  Inflammatory and pustule acne mainly on cheeks and chin - none on forehead, neck, back or chest  Psychiatric: Mood, memory, affect and judgment normal.  Vitals reviewed.  Spent 35 mins with the patient - greater than 50% of the visit was face to face counseling patient regarding her history and treatment plan.   Assessment and Plan :  Acne vulgaris - Plan: cephALEXin (KEFLEX) 500 MG capsule, CMP14+EGFR, CBC with Differential/Platelet, TSH, clindamycin (CLINDAGEL) 1 % gel  Generalized anxiety disorder - Plan: escitalopram (LEXAPRO) 10 MG tablet - I suspect this has been the patient's problem the whole time - we will treat her anxiety - we discussed how to take and what to expect from lexapro.  She will start with a lower dose to help decrease her side effects.  Screening cholesterol level - Plan: Lipid panel - she would like to have screening labs as she has anxiety about her health problems.  Windell Hummingbird PA-C  [facility] 01/16/2017 5:59 PM

## 2017-01-13 NOTE — Patient Instructions (Addendum)
     IF you received an x-ray today, you will receive an invoice from [facility]. Please contact [facility] at [phone] with questions or concerns regarding your invoice.   IF you received labwork today, you will receive an invoice from [facility]. Please contact [facility] at [phone] with questions or concerns regarding your invoice.   Our billing staff will not be able to assist you with questions regarding bills from these companies.  You will be contacted with the lab results as soon as they are available. The fastest way to get your results is to activate your My Chart account. Instructions are located on the last page of this paperwork. If you have not heard from us regarding the results in 2 weeks, please contact this office.    Please download the APP called mychart - then use the text to activate this APP - this will allow you to look at your labs and contact me as well as make appointments to see me in the future.  

## 2017-01-14 LAB — TSH: TSH: 0.698 u[IU]/mL (ref 0.450–4.500)

## 2017-01-14 LAB — LIPID PANEL
CHOL/HDL RATIO: 2.7 ratio (ref 0.0–4.4)
Cholesterol, Total: 181 mg/dL (ref 100–199)
HDL: 67 mg/dL (ref >39)
LDL Calculated: 98 mg/dL (ref 0–99)
Triglycerides: 79 mg/dL (ref 0–149)
VLDL Cholesterol Cal: 16 mg/dL (ref 5–40)

## 2017-01-14 LAB — CBC WITH DIFFERENTIAL/PLATELET
BASOS ABS: 0 10*3/uL (ref 0.0–0.2)
Basos: 1 %
EOS (ABSOLUTE): 0.1 10*3/uL (ref 0.0–0.4)
EOS: 3 %
HEMATOCRIT: 44.7 % (ref 34.0–46.6)
HEMOGLOBIN: 14.6 g/dL (ref 11.1–15.9)
IMMATURE GRANS (ABS): 0 10*3/uL (ref 0.0–0.1)
IMMATURE GRANULOCYTES: 0 %
LYMPHS: 29 %
Lymphocytes Absolute: 1.3 10*3/uL (ref 0.7–3.1)
MCH: 32.6 pg (ref 26.6–33.0)
MCHC: 32.7 g/dL (ref 31.5–35.7)
MCV: 100 fL — ABNORMAL HIGH (ref 79–97)
MONOCYTES: 5 %
Monocytes Absolute: 0.2 10*3/uL (ref 0.1–0.9)
Neutrophils Absolute: 2.8 10*3/uL (ref 1.4–7.0)
Neutrophils: 62 %
Platelets: 269 10*3/uL (ref 150–379)
RBC: 4.48 x10E6/uL (ref 3.77–5.28)
RDW: 13.2 % (ref 12.3–15.4)
WBC: 4.4 10*3/uL (ref 3.4–10.8)

## 2017-01-14 LAB — CMP14+EGFR
ALK PHOS: 70 IU/L (ref 39–117)
ALT: 22 IU/L (ref 0–32)
AST: 24 IU/L (ref 0–40)
Albumin/Globulin Ratio: 1.9 (ref 1.2–2.2)
Albumin: 4.5 g/dL (ref 3.5–5.5)
BUN/Creatinine Ratio: 11 (ref 9–23)
BUN: 7 mg/dL (ref 6–20)
Bilirubin Total: 0.6 mg/dL (ref 0.0–1.2)
CHLORIDE: 101 mmol/L (ref 96–106)
CO2: 26 mmol/L (ref 20–29)
CREATININE: 0.64 mg/dL (ref 0.57–1.00)
Calcium: 9.4 mg/dL (ref 8.7–10.2)
GFR calc non Af Amer: 124 mL/min/{1.73_m2} (ref >59)
GFR, EST AFRICAN AMERICAN: 142 mL/min/{1.73_m2} (ref >59)
GLUCOSE: 85 mg/dL (ref 65–99)
Globulin, Total: 2.4 g/dL (ref 1.5–4.5)
Potassium: 3.9 mmol/L (ref 3.5–5.2)
Sodium: 142 mmol/L (ref 134–144)
TOTAL PROTEIN: 6.9 g/dL (ref 6.0–8.5)

## 2017-01-15 ENCOUNTER — Encounter: Payer: Self-pay | Admitting: Radiology

## 2017-02-03 ENCOUNTER — Other Ambulatory Visit: Payer: Self-pay | Admitting: Physician Assistant

## 2017-02-03 ENCOUNTER — Encounter: Payer: Self-pay | Admitting: Physician Assistant

## 2017-02-03 MED ORDER — FLUCONAZOLE 150 MG PO TABS: 150.0000 mg | ORAL_TABLET | Freq: Once | ORAL | 0 refills | Status: AC

## 2017-02-06 ENCOUNTER — Encounter: Payer: Self-pay | Admitting: Physician Assistant

## 2017-02-18 ENCOUNTER — Other Ambulatory Visit: Payer: Self-pay | Admitting: Physician Assistant

## 2017-02-18 ENCOUNTER — Ambulatory Visit: Payer: BLUE CROSS/BLUE SHIELD | Admitting: Physician Assistant

## 2017-02-18 ENCOUNTER — Encounter: Payer: Self-pay | Admitting: Physician Assistant

## 2017-02-18 DIAGNOSIS — G479 Sleep disorder, unspecified: Secondary | ICD-10-CM

## 2017-02-18 DIAGNOSIS — G4726 Circadian rhythm sleep disorder, shift work type: Secondary | ICD-10-CM

## 2017-02-18 MED ORDER — TRAZODONE HCL 50 MG PO TABS: 25.0000 mg | ORAL_TABLET | Freq: Every evening | ORAL | 0 refills | Status: DC | PRN

## 2017-02-21 ENCOUNTER — Other Ambulatory Visit: Payer: Self-pay | Admitting: Physician Assistant

## 2017-02-28 ENCOUNTER — Ambulatory Visit: Payer: BLUE CROSS/BLUE SHIELD | Admitting: Physician Assistant

## 2017-03-14 ENCOUNTER — Telehealth: Payer: Commercial Managed Care - PPO | Admitting: Family

## 2017-03-14 ENCOUNTER — Encounter: Payer: Self-pay | Admitting: Physician Assistant

## 2017-03-14 DIAGNOSIS — L709 Acne, unspecified: Secondary | ICD-10-CM

## 2017-03-14 NOTE — Progress Notes (Signed)
Thank you for the details you included in the comment boxes. Those details are very helpful in determining the best course of treatment for you and help us to provide the best care. I called you and it says "mailbox is full". The e-visit program is set up for basic treatments that cover things you have already tried. I made sure you will not be charged for this visit. Given everything you said, you probably need to see a dermatologist and/or someone who can try dermabrasion but there are many other things that could be tried first, that are beyond the scope of this family medicine e-visit program. Please ask your family doc to refer you to a dermatologist so they can give the best care possible. They will do a better job than we will, and can do those treatments in person that cannot be done at home.      Based on what you shared with me it looks like you have a serious condition that should be evaluated in a face to face office visit.  NOTE: Even if you have entered your credit card information for this eVisit, you will not be charged.   If you are having a true medical emergency please call 911.  If you need an urgent face to face visit, [place] has four urgent care centers for your convenience.  If you need care fast and have a high deductible or no insurance consider:   [email or web address]  [phone]  [address], [address] [place], [place] [address] 8 am to 8 pm Monday-Friday 10 am to 4 pm Saturday-Sunday   The following sites will take your  insurance:    . [facility]  [phone] Get Driving Directions Find a Provider at this Location  [address] [place], [place] [address] . 10 am to 8 pm Monday-Friday . 12 pm to 8 pm Saturday-Sunday   . [facility]  [phone] Get Driving Directions Find a Provider at this Location  [address] [place], [place] [address] . 8 am to  8 pm Monday-Friday . 9 am to 6 pm Saturday . 11 am to 6 pm Sunday   . [facility] [place]  [phone] Get Driving Directions  [address] [place], [place] [address] . 8 am to 8 pm Monday-Friday . 8 am to 4 pm Saturday-Sunday   Your e-visit answers were reviewed by a board certified advanced clinical practitioner to complete your personal care plan.  Thank you for using e-Visits.

## 2017-03-18 ENCOUNTER — Encounter: Payer: Self-pay | Admitting: Physician Assistant

## 2017-03-20 MED ORDER — DOXYCYCLINE HYCLATE 50 MG PO CAPS: 50.0000 mg | ORAL_CAPSULE | Freq: Two times a day (BID) | ORAL | 0 refills | Status: DC

## 2017-03-20 NOTE — Telephone Encounter (Signed)
Meds ordered this encounter  Medications  . doxycycline (VIBRAMYCIN) 50 MG capsule    Sig: Take 1 capsule (50 mg total) by mouth 2 (two) times daily.    Dispense:  180 capsule    Refill:  0    Order Specific Question:   Supervising Provider    Answer:   Clelia CroftSHAW, EVA N [[id number]]    Please contact patient to schedule follow-up re: acne with Sarah in the next 6-8 weeks

## 2017-03-30 ENCOUNTER — Encounter: Payer: Self-pay | Admitting: Physician Assistant

## 2017-04-19 ENCOUNTER — Encounter: Payer: Self-pay | Admitting: Physician Assistant

## 2017-04-22 ENCOUNTER — Telehealth: Payer: Self-pay | Admitting: Physician Assistant

## 2017-04-22 DIAGNOSIS — Z91018 Allergy to other foods: Secondary | ICD-10-CM

## 2017-04-22 NOTE — Telephone Encounter (Signed)
Copied from CRM [id number]. Topic: Referral - Request >> Apr 22, 2017 10:13 AM Stephannie LiSimmons, Darielle Hancher L, NT wrote: Reason for CRM: Patient would  like a referral to an allergist please advise  [phone]

## 2017-04-22 NOTE — Telephone Encounter (Signed)
Please see note below. 

## 2017-04-23 NOTE — Telephone Encounter (Signed)
Ok done

## 2017-05-29 ENCOUNTER — Encounter: Payer: Self-pay | Admitting: Physician Assistant

## 2017-06-25 ENCOUNTER — Ambulatory Visit: Payer: BLUE CROSS/BLUE SHIELD | Admitting: Allergy

## 2017-07-02 ENCOUNTER — Emergency Department (HOSPITAL_BASED_OUTPATIENT_CLINIC_OR_DEPARTMENT_OTHER)
Admission: EM | Admit: 2017-07-02 | Discharge: 2017-07-02 | Disposition: A | Discharge status: Home / Self Care | Payer: Managed Care, Other (non HMO) | Attending: Emergency Medicine | Admitting: Emergency Medicine

## 2017-07-02 ENCOUNTER — Other Ambulatory Visit: Payer: Self-pay

## 2017-07-02 ENCOUNTER — Encounter (HOSPITAL_BASED_OUTPATIENT_CLINIC_OR_DEPARTMENT_OTHER): Payer: Self-pay

## 2017-07-02 DIAGNOSIS — R208 Other disturbances of skin sensation: Secondary | ICD-10-CM | POA: Insufficient documentation

## 2017-07-02 DIAGNOSIS — L853 Xerosis cutis: Secondary | ICD-10-CM | POA: Diagnosis not present

## 2017-07-02 DIAGNOSIS — G501 Atypical facial pain: Secondary | ICD-10-CM | POA: Diagnosis present

## 2017-07-02 NOTE — ED Notes (Signed)
ED Provider at bedside. 

## 2017-07-02 NOTE — Discharge Instructions (Signed)
Keep your face protected from the elements such as sun, wind, and keep your face moisturized. If you develop fever or other concerns, please seek reevaluation.

## 2017-07-02 NOTE — ED Triage Notes (Addendum)
Pt reports applying OTC Acne med topically on her face, exfoliating and too long sun exposure x3 weeks ago. Pt now has 10/10 facial pain of t-zone and cheeks w/o any relief. Pt denies taking any oral abx or acne meds. Pt A+OX4, speaking in complete sentences, and frustrated.

## 2017-07-02 NOTE — ED Provider Notes (Signed)
[facility] EMERGENCY DEPARTMENT Provider Note   CSN: [id number] Arrival date & time: 07/02/17  0510     History   Chief Complaint Chief Complaint  Patient presents with  . Facial Pain    HPI Samantha Hernandez is a 27 y.o. female.  HPI  Put differin on skin, weeks ago, thought she was putting on sunscreen but was burned Before then over the last 3 mos, saw aesthetician, had chemical peel which she didn't like but 2 weeks later.  Some products worked, then wouldn't wear sunscreen and had facial sensitivity/photosensitivity. Then would try another product or sunscreen and it would make it worse.  Shift started 1115PM had worsening symptoms Felt face has been inflammed, hard to move, burning and tighteness Felt like facial swelling had worsened since last night Has been going on for the last month Nausea, lightheadedness, for a long time No fevers No new exposures in the last few days.  No hx of allergies or sensitive skin     Past Medical History:  Diagnosis Date  . Acne   . ADD (attention deficit disorder)   . Anxiety   . Insomnia   . Reactive depression   . Tinnitus     Patient Active Problem List   Diagnosis Date Noted  . ADD (attention deficit disorder) 07/19/2011    History reviewed. No pertinent surgical history.   OB History   None      Home Medications    Prior to Admission medications   Medication Sig Start Date End Date Taking? Authorizing Provider  traZODone (DESYREL) 50 MG tablet Take 0.5-1 tablets (25-50 mg total) by mouth at bedtime as needed for sleep. 02/18/17   McVey, Madelaine Bhat, PA-C    Family History Family History  Problem Relation Age of Onset  . Anxiety disorder Mother   . Depression Father   . Anxiety disorder Sister   . Bipolar disorder Paternal Aunt   . Bipolar disorder Cousin     Social History Social History   Tobacco Use  . Smoking status: Never Smoker  . Smokeless tobacco: Never Used  Substance Use  Topics  . Alcohol use: No  . Drug use: No     Allergies   Patient has no known allergies.   Review of Systems Review of Systems   Physical Exam Updated Vital Signs BP 134/84 (BP Location: Right Arm)   Pulse 72   Temp 97.8 F (36.6 C) (Oral)   Resp 16   Ht 5\' 7"  (1.702 m)   Wt 87.8 kg (193 lb 9 oz)   SpO2 100%   BMI 30.32 kg/m   Physical Exam  Constitutional: She is oriented to person, place, and time. She appears well-developed and well-nourished. No distress.  HENT:  Head: Normocephalic and atraumatic.  Eyes: Conjunctivae and EOM are normal.  Neck: Normal range of motion.  Cardiovascular: Normal rate.  Pulmonary/Chest: Effort normal. No respiratory distress.  Musculoskeletal: She exhibits no edema or tenderness.  Neurological: She is alert and oriented to person, place, and time.  Skin: Skin is warm and dry. No rash noted. She is not diaphoretic.  Dry pink colored skin across cheeks Healed acne scars Pustule left cheek, no surrounding erythema  Nursing note and vitals reviewed.    ED Treatments / Results  Labs (all labs ordered are listed, but only abnormal results are displayed) Labs Reviewed - No data to display  EKG None  Radiology No results found.  Procedures Procedures (including critical care  time)  Medications Ordered in ED Medications - No data to display   Initial Impression / Assessment and Plan / ED Course  I have reviewed the triage vital signs and the nursing notes.  Pertinent labs & imaging results that were available during my care of the patient were reviewed by me and considered in my medical decision making (see chart for details).     27 year old female presents with concern for facial pain after having chemical peel, trying other creams, using Differin for acne 3 weeks ago, having sunburn, [place]  Patient with mild facial erythema, however no signs of cellulitis.  Single pustule  consistent with acne on left cheek without signs of abscess. Doubt this represents malar rash in absence of other symptoms.  Reports face feels numb like she can't move it, which given bilateral nature of it, pt actively moving face, feel is likely sensation of facial tightness from dry skin.  Not consistent with bilateral bells palsy, stroke or other.   Symptoms of tightness and burning likely multifactorial secondary to side effects of differin, sun burn and wind burn, with dry skin and facial irritation. Recommend facial protection from sun and wind, facial moisturizing, tylenol and ibuprofen and dermatology follow up.    Final Clinical Impressions(s) / ED Diagnoses   Final diagnoses:  Facial burning  Dry skin    ED Discharge Orders    None       Alvira MondaySchlossman, Rilla Buckman, MD 07/02/17 0630

## 2017-07-06 ENCOUNTER — Encounter: Payer: Self-pay | Admitting: Physician Assistant

## 2017-07-13 ENCOUNTER — Encounter: Payer: Self-pay | Admitting: Physician Assistant

## 2017-07-14 ENCOUNTER — Encounter: Payer: Self-pay | Admitting: Physician Assistant

## 2017-07-24 ENCOUNTER — Encounter: Payer: Self-pay | Admitting: Physician Assistant

## 2017-07-31 ENCOUNTER — Encounter: Payer: Self-pay | Admitting: Physician Assistant

## 2017-08-15 ENCOUNTER — Encounter: Payer: Self-pay | Admitting: Physician Assistant

## 2017-08-18 ENCOUNTER — Encounter: Payer: Self-pay | Admitting: Physician Assistant

## 2017-08-19 ENCOUNTER — Ambulatory Visit: Payer: Medicaid Other | Admitting: Physician Assistant

## 2017-08-19 ENCOUNTER — Encounter: Payer: Self-pay | Admitting: Physician Assistant

## 2017-08-22 ENCOUNTER — Ambulatory Visit (INDEPENDENT_AMBULATORY_CARE_PROVIDER_SITE_OTHER): Payer: 59 | Admitting: Physician Assistant

## 2017-08-22 ENCOUNTER — Encounter: Payer: Self-pay | Admitting: Physician Assistant

## 2017-08-22 ENCOUNTER — Other Ambulatory Visit: Payer: Self-pay

## 2017-08-22 VITALS — BP 104/62 | HR 102 | Temp 97.8°F | Resp 18 | Ht 68.11 in | Wt 198.4 lb

## 2017-08-22 DIAGNOSIS — G4700 Insomnia, unspecified: Secondary | ICD-10-CM

## 2017-08-22 DIAGNOSIS — R208 Other disturbances of skin sensation: Secondary | ICD-10-CM | POA: Diagnosis not present

## 2017-08-22 DIAGNOSIS — R238 Other skin changes: Secondary | ICD-10-CM | POA: Diagnosis not present

## 2017-08-22 DIAGNOSIS — N912 Amenorrhea, unspecified: Secondary | ICD-10-CM

## 2017-08-22 DIAGNOSIS — L700 Acne vulgaris: Secondary | ICD-10-CM | POA: Diagnosis not present

## 2017-08-22 DIAGNOSIS — F411 Generalized anxiety disorder: Secondary | ICD-10-CM

## 2017-08-22 DIAGNOSIS — Z3009 Encounter for other general counseling and advice on contraception: Secondary | ICD-10-CM

## 2017-08-22 NOTE — Patient Instructions (Addendum)
     IF you received an x-ray today, you will receive an invoice from [facility]. Please contact [facility] at [phone] with questions or concerns regarding your invoice.   IF you received labwork today, you will receive an invoice from [facility]. Please contact [facility] at [phone] with questions or concerns regarding your invoice.   Our billing staff will not be able to assist you with questions regarding bills from these companies.  You will be contacted with the lab results as soon as they are available. The fastest way to get your results is to activate your My Chart account. Instructions are located on the last page of this paperwork. If you have not heard from us regarding the results in 2 weeks, please contact this office.     

## 2017-08-22 NOTE — Progress Notes (Signed)
Samantha Hernandez  MRN: [id number] DOB: [birth year]  PCP: Morrell Riddle, PA-C  Chief Complaint  Patient presents with  . Annual Exam    needs some referrals, allergy testing, work note pap   . Depression    is a 16    Subjective:  Pt presents to clinic for multiple concerns.  Patient has a long-standing anxiety that she has still not completely admitted to but at this point think she is ready to start treating.  She has tried Lexapro in the past for a week and felt like it made her she will she is not sure how she trust medicines.  She has never seen therapist to help her understand her condition.  She continues to be very worried about her skin on her face.  She has had bad acne in the past and has significant scarring.  She has used multiple dermatologic acne treatments.  As well as intense chemical peeling with facialist.  Her current situation is continued acne scarring, but incredible skin sensitivity burning skin and trouble with the sun.  She has resorted to using zinc oxide sunscreen which she is applying every 2 hours, because that is with the bottle says to do.  Most of her sun exposure is from car to building.  She states her current pain is deep burning pain.  She recently saw a dermatologist but was unhappy with her care there.  She has significant anxiety from her acne scarring as well as her current pain.  She would like to see another specialist.  She is really concerned that they will not listen to her.  Along with daily sunscreen she is using simple face wash as well as a moisturizing cream.  She would like recommendations on facial preparations as well as sunscreens.  She is currently working third shift.  She is having trouble with this shift.  She already has trouble sleeping and with rotating sleep schedules it has become worse.  She continues to use the trazodone as needed but she is just not sure when to take it based on when she is sleeping for that day.  She wakes up anxious  like she has to calm herself before the day even starts   She has daily anxiety that starts upon waking. She has had thoughts that she does not want to live but she knows that she does not want to die - she has no plans to hurt herself.  She does bring up childhood neglect concerns.  She brings up being mad at herself for taking so long to get out from underneath her parents.  She is currently herself.  She talks about a lot of regret.  She wishes she was in a different part of her life.  She wishes she could change things in the past because she feels like that would make her in a different place.  She is very negative towards herself and her past decisions.  Wants to see a gyn - never had a pap smear -over a year ago since she had her last.  She does admit to being incredibly stressed and anxious during that period of time- she is not sexually active -she is also interested in an IUD she is in a current relationship and would like to become sexually active.   History is obtained by patient.  Review of Systems  Eyes: Negative for visual disturbance.  Respiratory: Positive for shortness of breath (With anxiety).   Cardiovascular: Positive for chest  pain (With anxiety).  Endocrine: Negative for cold intolerance and heat intolerance.  Genitourinary: Positive for menstrual problem (No menses in greater than a year). Negative for pelvic pain and vaginal discharge.  Skin: Positive for rash.  Psychiatric/Behavioral: Positive for dysphoric mood and sleep disturbance. Negative for self-injury and suicidal ideas. The patient is nervous/anxious.     Patient Active Problem List   Diagnosis Date Noted  . ADD (attention deficit disorder) 07/19/2011    Current Outpatient Medications on File Prior to Visit  Medication Sig Dispense Refill  . ibuprofen (ADVIL,MOTRIN) 800 MG tablet     . traZODone (DESYREL) 50 MG tablet Take 0.5-1 tablets (25-50 mg total) by mouth at bedtime as needed for sleep. (Patient  not taking: Reported on 08/22/2017) 60 tablet 0   No current facility-administered medications on file prior to visit.     No Known Allergies  Past Medical History:  Diagnosis Date  . Acne   . ADD (attention deficit disorder)   . Allergy   . Anxiety   . Insomnia   . Reactive depression   . 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 Smoking status: Never Smoker  . Smokeless tobacco: Never Used  Substance Use Topics  . Alcohol use: No  . Drug use: No   family history includes Anxiety disorder in her mother and sister; Bipolar disorder in her cousin and paternal aunt; Depression in her father.     Objective:  BP 104/62   Pulse (!) 102   Temp 97.8 F (36.6 C) (Oral)   Resp 18   Ht 5' 8.11" (1.73 m)   Wt 198 lb 6.4 oz (90 kg)   LMP  (LMP Unknown)   SpO2 98%   BMI 30.07 kg/m  Body mass index is 30.07 kg/m.  Physical Exam  Constitutional: She is oriented to person, place, and time.  HENT:  Head: Normocephalic and atraumatic.  Right Ear: Hearing and external ear normal.  Left Ear: Hearing and external ear normal.  Eyes: Conjunctivae are normal.  Neck: Normal range of motion.  Cardiovascular: Normal rate, regular rhythm and normal heart sounds.  No murmur heard. Pulmonary/Chest: Effort normal and breath sounds normal. She has no wheezes.  Neurological: She is alert and oriented to person, place, and time.  Skin: Skin is warm and dry.  Psychiatric: Her behavior is normal. Judgment and thought content normal. Her mood appears anxious. Cognition and memory are normal.  conversation moves from topic to topic but always ends on her anxiety and her negative thoughts about herself and what she has done to and for herself  Vitals reviewed.  Spent 40 mins with the patient - greater than 50% of the  visit was face to face counseling patient regarding current situation next steps what patient can do.  Assessment and Plan :  Skin sensitivity - Plan: Ambulatory referral to Dermatology  Skin pain - Plan: Ambulatory referral to Dermatology  Acne vulgaris - Plan: Ambulatory referral to Dermatology  Generalized anxiety disorder - Plan: Ambulatory referral to Psychiatry  Insomnia, unspecified type - Plan: Ambulatory referral to Psychiatry -patient will continue trazodone for sleep.  I did write her a note for HR at her job to consider putting her on first shift to help with her sleep schedule.  This  would allow her to go to bed at the same time and wake up at the same time every day.  I suspect this will help her anxiety as her current shift changes is increasing her anxiety.  Amenorrhea - Plan: Ambulatory referral to Gynecology  Counseling for birth control regarding intrauterine device (IUD) - Plan: Ambulatory referral to Gynecology  Patient has extreme anxiety.  I believe she would benefit from psychiatrist with psychology in the same office.  I would like to make sure we have the correct diagnosis and I am unable to determine that today in the office.  When I first saw her in October 2018 she had a therapist at that time.  She has not continue to see him.  She needs dermatology, she definitely has acne scarring but I have to wonder the role her anxiety plays in her continued problems with facial pain.  She was encouraged to stop all retinoid products, use sensitive skin moisturizer with sunblock added to that encouraged her to stop using sunblock every 2 hours as that may be irritating to her skin.  Tried to reassure patient that there is somebody that can help her.    Patient is interested in IUD for pregnancy control.  She has not had a Pap smear, at this visit with focus on anxiety will defer Pap smear to gynecology as well as amenorrhea work-up since she is interested in IUD.  She did have  a normal TSH in October 2018.  Patient verbalized to me that she understood the following: their diagnosis, what is being done for it, what to expect and what should be done at home.  See after visit summary for patient specific instructions.   Benny Lennert PA-C  Primary Care at [facility] 08/22/2017 2:50 PM

## 2017-08-25 ENCOUNTER — Encounter: Payer: Self-pay | Admitting: Physician Assistant

## 2017-08-26 ENCOUNTER — Telehealth: Payer: Self-pay | Admitting: Obstetrics and Gynecology

## 2017-08-26 ENCOUNTER — Other Ambulatory Visit: Payer: Self-pay | Admitting: Physician Assistant

## 2017-08-26 DIAGNOSIS — F411 Generalized anxiety disorder: Secondary | ICD-10-CM

## 2017-08-26 MED ORDER — DESVENLAFAXINE SUCCINATE ER 25 MG PO TB24: 25.0000 mg | ORAL_TABLET | Freq: Every day | ORAL | 0 refills | Status: DC

## 2017-08-26 NOTE — Progress Notes (Signed)
Pt wants to start pristiq for her anxiety

## 2017-08-26 NOTE — Telephone Encounter (Signed)
Called and left a message for patient to call back to schedule a new patient doctor referral appointment with our office to see any provider for a birth control consultation.

## 2017-08-28 NOTE — Telephone Encounter (Signed)
Called and left a message for patient to call back to schedule a new patient doctor referral appointment with our office to see any provider for a birth control consultation. °

## 2017-09-06 ENCOUNTER — Encounter: Payer: Self-pay | Admitting: Physician Assistant

## 2017-09-08 ENCOUNTER — Encounter: Payer: Self-pay | Admitting: Physician Assistant

## 2017-09-12 ENCOUNTER — Emergency Department (HOSPITAL_BASED_OUTPATIENT_CLINIC_OR_DEPARTMENT_OTHER)
Admission: EM | Admit: 2017-09-12 | Discharge: 2017-09-12 | Disposition: A | Discharge status: Home / Self Care | Payer: Managed Care, Other (non HMO) | Attending: Emergency Medicine | Admitting: Emergency Medicine

## 2017-09-12 ENCOUNTER — Encounter: Payer: Self-pay | Admitting: Physician Assistant

## 2017-09-12 ENCOUNTER — Other Ambulatory Visit: Payer: Self-pay

## 2017-09-12 ENCOUNTER — Ambulatory Visit: Payer: Self-pay | Admitting: *Deleted

## 2017-09-12 ENCOUNTER — Encounter (HOSPITAL_BASED_OUTPATIENT_CLINIC_OR_DEPARTMENT_OTHER): Payer: Self-pay | Admitting: *Deleted

## 2017-09-12 DIAGNOSIS — F419 Anxiety disorder, unspecified: Secondary | ICD-10-CM | POA: Diagnosis present

## 2017-09-12 DIAGNOSIS — F329 Major depressive disorder, single episode, unspecified: Secondary | ICD-10-CM | POA: Insufficient documentation

## 2017-09-12 DIAGNOSIS — Z79899 Other long term (current) drug therapy: Secondary | ICD-10-CM | POA: Insufficient documentation

## 2017-09-12 DIAGNOSIS — F909 Attention-deficit hyperactivity disorder, unspecified type: Secondary | ICD-10-CM | POA: Insufficient documentation

## 2017-09-12 LAB — CBG MONITORING, ED: GLUCOSE-CAPILLARY: 73 mg/dL (ref 65–99)

## 2017-09-12 NOTE — ED Triage Notes (Signed)
Pt reports that she had a stressful situation at work and 'knows my blood pressure is up'.  States that she did not check her BP prior to coming here. Pt appears very anxious. Also reports that she has a hx of anxiety.

## 2017-09-12 NOTE — ED Provider Notes (Signed)
[facility] EMERGENCY DEPARTMENT Provider Note   CSN: [id number] Arrival date & time: 09/12/17  0132     History   Chief Complaint Chief Complaint  Patient presents with  . Anxiety    HPI Samantha Hernandez is a 27 y.o. female.  The history is provided by the patient.  Anxiety  This is a new problem. The current episode started more than 1 week ago. The problem occurs constantly. The problem has been gradually worsening. Pertinent negatives include no chest pain, no abdominal pain, no headaches and no shortness of breath. Nothing aggravates the symptoms. Nothing relieves the symptoms. She has tried nothing for the symptoms. The treatment provided no relief.  Patient thinks her BP is up.  It is not.  She is anxious and has not seen a therapist yet and it has been 9 months.  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    Past Medical History:  Diagnosis Date  . Acne   . ADD (attention deficit disorder)   . Allergy   . Anxiety   . Insomnia   . Reactive depression   . Tinnitus     Patient Active Problem List   Diagnosis Date Noted  . ADD (attention deficit disorder) 07/19/2011    History reviewed. No pertinent surgical history.   OB History   None      Home Medications    Prior to Admission medications   Medication Sig Start Date End Date Taking? Authorizing Provider  Desvenlafaxine Succinate ER (PRISTIQ) 25 MG TB24 Take 25 mg by mouth daily. 08/26/17   Gale Journey, Damaris Hippo, PA-C  ibuprofen (ADVIL,MOTRIN) 800 MG tablet  07/01/17   [provider]  traZODone (DESYREL) 50 MG tablet Take 0.5-1 tablets (25-50 mg total) by mouth at bedtime as needed for sleep. Patient not taking: Reported on 08/22/2017 02/18/17   McVey, Gelene Mink, PA-C    Family History Family History  Problem Relation Age of Onset  . Anxiety disorder Mother   . Depression Father   . Anxiety disorder Sister   . Bipolar disorder Paternal Aunt   . Bipolar disorder Cousin      Social History Social History   Tobacco Use  . Smoking status: Never Smoker  . Smokeless tobacco: Never Used  Substance Use Topics  . Alcohol use: No  . Drug use: No     Allergies   Patient has no known allergies.   Review of Systems Review of Systems  Constitutional: Negative for diaphoresis and fever.  Respiratory: Negative for shortness of breath.   Cardiovascular: Negative for chest pain.  Gastrointestinal: Negative for abdominal pain, nausea and vomiting.  Endocrine: Negative for cold intolerance, heat intolerance, polydipsia, polyphagia and polyuria.  Musculoskeletal: Negative for back pain.  Neurological: Negative for dizziness, tremors, seizures, syncope, facial asymmetry, speech difficulty, weakness, light-headedness, numbness and headaches.  Psychiatric/Behavioral: The patient is nervous/anxious.   All other systems reviewed and are negative.    Physical Exam Updated Vital Signs BP 131/82 (BP Location: Right Arm)   Pulse 68   Temp 98.2 F (36.8 C) (Oral)   Resp 18   Ht '5\' 8"'  (1.727 m)   Wt 89.8 kg (198 lb)   LMP  (LMP Unknown)   SpO2 100%   BMI 30.11 kg/m   Physical Exam  Constitutional: She appears well-developed and well-nourished. No distress.  HENT:  Head: Normocephalic and atraumatic.  Eyes: Pupils are equal, round, and reactive to light. Conjunctivae are normal.  Neck: Normal range of motion. Neck supple.  Cardiovascular: Normal rate, regular rhythm, normal heart sounds and intact distal pulses.  Pulmonary/Chest: Effort normal and breath sounds normal. No stridor. No respiratory distress.  Abdominal: Soft. Bowel sounds are normal. She exhibits no mass. There is no tenderness. There is no rebound and no guarding.  Musculoskeletal: Normal range of motion.  Neurological: She is alert. She displays normal reflexes.  Skin: Skin is warm and dry. Capillary refill takes less than 2 seconds.  Psychiatric: Her mood appears anxious.  Nursing note  and vitals reviewed.    ED Treatments / Results  Labs (all labs ordered are listed, but only abnormal results are displayed) Labs Reviewed - No data to display  EKG None  Radiology No results found.  Procedures Procedures (including critical care time)    Final Clinical Impressions(s) / ED Diagnoses   Patient with anxiety.  Will need a psychiatrist and therapist for therapy.  BP is normal patient is insisting she has conditions we do not diagnose in the ED.  She is instructed to follow up with her PMD for her myriad of concerns.     Return for weakness, numbness, changes in vision or speech, fevers >100.4 unrelieved by medication, shortness of breath, intractable vomiting, or diarrhea, abdominal pain, Inability to tolerate liquids or food, cough, altered mental status or any concerns. No signs of systemic illness or infection. The patient is nontoxic-appearing on exam and vital signs are within normal limits.   I have reviewed the triage vital signs and the nursing notes. Pertinent labs &imaging results that were available during my care of the patient were reviewed by me and considered in my medical decision making (see chart for details).  After history, exam, and medical workup I feel the patient has been appropriately medically screened and is safe for discharge home. Pertinent diagnoses were discussed with the patient. Patient was given return precautions.      Ady Heimann, MD 09/12/17 0888

## 2017-09-12 NOTE — Telephone Encounter (Signed)
Copied from CRM [id number]. Topic: Inquiry >> Sep 12, 2017  9:38 AM Crist InfanteHarrald, Kathy J wrote: Reason for CRM: pt calling back again and was advised by the nurse to go to the ED.  [facility] [place] [facility] and when she asked me where she should go, I advised her to go there.  Pt states she had been there before. Then said her sister is a Engineer, civil (consulting)nurse, and advised her no reason to go to the ED. She should go to [facility]. Pt states she will wait on Sarah to call her back.  I advised pt, per nurse, that with her symptoms, she should really go on to the emergency room. Pt declined and said she is going to wait and have Benny LennertSarah [name] tell her what to do,

## 2017-09-12 NOTE — ED Notes (Signed)
BSG 73

## 2017-09-12 NOTE — Telephone Encounter (Signed)
Pt stated that she started on Pristiq Wednesday. She feels like she can't take a deep breath, staggering, blurred vision, head pounding and when she attempts to drink water, she vomits. She has had 2 doses of this medication. She states that she is dehydrated but she voided and her urine was really clear.  She had called the ems before and gone to the emergency dept.  I have advised her to go back to the ED. Pt stated that she will go back either to call 911 or her mom will take her. I will route to [facility] and ask for the provider to give the patient a call back.  Reason for Disposition . Caller has URGENT medication question about med that PCP prescribed and triager unable to answer question  Answer Assessment - Initial Assessment Questions 1. SYMPTOMS: "Do you have any symptoms?"     Yes . Several symptoms 2. SEVERITY: If symptoms are present, ask "Are they mild, moderate or severe?"     severe  Protocols used: MEDICATION QUESTION CALL-A-AH

## 2017-09-15 ENCOUNTER — Telehealth: Payer: Self-pay | Admitting: Physician Assistant

## 2017-09-15 NOTE — Telephone Encounter (Signed)
Left vm for Okey RegalCarol at [facility] to check status of pt referral 6/17

## 2017-09-16 ENCOUNTER — Encounter (INDEPENDENT_AMBULATORY_CARE_PROVIDER_SITE_OTHER): Payer: Self-pay

## 2017-09-19 ENCOUNTER — Ambulatory Visit: Payer: Managed Care, Other (non HMO) | Admitting: Obstetrics and Gynecology

## 2017-09-22 ENCOUNTER — Ambulatory Visit: Payer: 59 | Admitting: Obstetrics and Gynecology

## 2017-09-22 ENCOUNTER — Other Ambulatory Visit: Payer: Self-pay

## 2017-09-22 ENCOUNTER — Other Ambulatory Visit (HOSPITAL_COMMUNITY)
Admission: RE | Admit: 2017-09-22 | Discharge: 2017-09-22 | Disposition: A | Discharge status: Home / Self Care | Payer: Managed Care, Other (non HMO) | Source: Ambulatory Visit | Attending: Obstetrics and Gynecology | Admitting: Obstetrics and Gynecology

## 2017-09-22 ENCOUNTER — Ambulatory Visit (INDEPENDENT_AMBULATORY_CARE_PROVIDER_SITE_OTHER): Payer: Managed Care, Other (non HMO) | Admitting: Obstetrics and Gynecology

## 2017-09-22 ENCOUNTER — Encounter: Payer: Self-pay | Admitting: Obstetrics and Gynecology

## 2017-09-22 VITALS — BP 122/70 | HR 84 | Resp 16 | Ht 67.5 in | Wt 198.0 lb

## 2017-09-22 DIAGNOSIS — L700 Acne vulgaris: Secondary | ICD-10-CM

## 2017-09-22 DIAGNOSIS — Z01419 Encounter for gynecological examination (general) (routine) without abnormal findings: Secondary | ICD-10-CM | POA: Diagnosis not present

## 2017-09-22 DIAGNOSIS — Z113 Encounter for screening for infections with a predominantly sexual mode of transmission: Secondary | ICD-10-CM | POA: Diagnosis present

## 2017-09-22 DIAGNOSIS — Z124 Encounter for screening for malignant neoplasm of cervix: Secondary | ICD-10-CM

## 2017-09-22 DIAGNOSIS — F418 Other specified anxiety disorders: Secondary | ICD-10-CM

## 2017-09-22 DIAGNOSIS — N914 Secondary oligomenorrhea: Secondary | ICD-10-CM

## 2017-09-22 DIAGNOSIS — N870 Mild cervical dysplasia: Secondary | ICD-10-CM | POA: Diagnosis not present

## 2017-09-22 DIAGNOSIS — Z0000 Encounter for general adult medical examination without abnormal findings: Secondary | ICD-10-CM

## 2017-09-22 DIAGNOSIS — E559 Vitamin D deficiency, unspecified: Secondary | ICD-10-CM | POA: Diagnosis not present

## 2017-09-22 DIAGNOSIS — Z6830 Body mass index (BMI) 30.0-30.9, adult: Secondary | ICD-10-CM

## 2017-09-22 DIAGNOSIS — R21 Rash and other nonspecific skin eruption: Secondary | ICD-10-CM

## 2017-09-22 DIAGNOSIS — L680 Hirsutism: Secondary | ICD-10-CM | POA: Diagnosis not present

## 2017-09-22 MED ORDER — DROSPIRENONE-ETHINYL ESTRADIOL 3-0.02 MG PO TABS: 1.0000 | ORAL_TABLET | Freq: Every day | ORAL | 0 refills | Status: AC

## 2017-09-22 MED ORDER — MEDROXYPROGESTERONE ACETATE 5 MG PO TABS: 5.0000 mg | ORAL_TABLET | Freq: Every day | ORAL | 0 refills | Status: DC

## 2017-09-22 NOTE — Progress Notes (Signed)
27 y.o. [name] SingleCaucasianF here for annual exam. She has had 2 cycles in the last year. She c/o hair thinning, fatigue, dry skin, acne (worse in the last year). She is pulling black hair out of her chin, upper lip. No galactorrhea.   Sexually active, using condoms sometimes. Doesn't want to get pregnant. Same partner x 1 year, no exclusive (she thinks he has other problems). Not sexually active since last month. She has been depressed, anxious. Has been bad enough that she has trouble functioning. She is going to work, has had outburst at work. She has been trying to get into a therapist or Psychiatrist. Has an appointment for next month with a Psychiatrist.  She doesn't want to kill herself, but wishes she wasn't here sometimes. No thoughts of hurting others.   She has tried pristiq for depression, had severe side effects right away. She was light headed, palpitations, emesis.  Tried lexapro, made her groggy and had nausea.  She is seeing a dermatologist, wanted her to get an ANA secondary to her rash. She has had a rash on her face, very sensitive to the sun  No migraines.   Menarche at 51, monthly, heavy and cramping. Started getting irregular in high school, she would skip a month or two. Got very irregular at the end of 2016 when she got very stressed out.     No LMP recorded. (Menstrual status: Irregular Periods).          Sexually active: Yes.    The current method of family planning is none.    Exercising: No.  The patient does not participate in regular exercise at present. Smoker:  no  Health Maintenance: Pap:  2010 WNL per patient  History of abnormal Pap:  no TDaP:  2013 Gardasil: no    reports that she has never smoked. She has never used smokeless tobacco. She reports that she does not drink alcohol or use drugs. Works for [employer], lives with her parents.   Past Medical History:  Diagnosis Date  . Acne   . ADD (attention deficit disorder)   . Allergy   .  Amenorrhea   . Anxiety   . Hormone disorder   . Insomnia   . Reactive depression   . Tinnitus     History reviewed. No pertinent surgical history.  Current Outpatient Medications  Medication Sig Dispense Refill  . ASTAXANTHIN PO Take 12 mg by mouth daily.    Marland Kitchen b complex vitamins capsule Take 1 capsule by mouth daily.    . cholecalciferol (VITAMIN D) 1000 units tablet Take 1,000 Units by mouth 2 (two) times daily.    Marland Kitchen ibuprofen (ADVIL,MOTRIN) 800 MG tablet     . Omega-3 Fatty Acids (FISH OIL) 1200 MG CAPS Take by mouth.    . Polypodium Leucotomos (HELIOCARE PO) Take by mouth.    . Zinc 50 MG CAPS Take by mouth.     No current facility-administered medications for this visit.     Family History  Problem Relation Age of Onset  . Anxiety disorder Mother   . Depression Father   . Anxiety disorder Sister   . Bipolar disorder Paternal Aunt   . Diabetes Paternal Aunt   . Bipolar disorder Cousin   . Diabetes Paternal Grandfather     Review of Systems  Constitutional: Positive for fatigue.  HENT: Negative.   Eyes: Negative.   Respiratory: Negative.   Cardiovascular: Negative.   Gastrointestinal: Negative.   Endocrine: Negative.   Genitourinary:  Negative.        Amenorrhea   Musculoskeletal: Negative.   Skin: Negative.   Allergic/Immunologic: Negative.   Neurological: Negative.   Psychiatric/Behavioral: Positive for agitation and sleep disturbance.    Exam:   BP 122/70 (BP Location: Right Arm, Patient Position: Sitting, Cuff Size: Normal)   Pulse 84   Resp 16   Ht 5' 7.5" (1.715 m)   Wt 198 lb (89.8 kg)   BMI 30.55 kg/m   Weight change: @WEIGHTCHANGE @ Height:   Height: 5' 7.5" (171.5 cm)  Ht Readings from Last 3 Encounters:  09/22/17 5' 7.5" (1.715 m)  09/12/17 5\' 8"  (1.727 m)  08/22/17 5' 8.11" (1.73 m)    General appearance: alert, cooperative and appears stated age Head: Normocephalic, without obvious abnormality, atraumatic Neck: no adenopathy, supple,  symmetrical, trachea midline and thyroid normal to inspection and palpation Lungs: clear to auscultation bilaterally Cardiovascular: regular rate and rhythm Breasts: normal appearance, no masses or tenderness Abdomen: soft, non-tender; non distended,  no masses,  no organomegaly Extremities: extremities normal, atraumatic, no cyanosis or edema Skin: Skin color, texture, turgor normal. Severe facial acne. Mild hirsutism on lowe abdomen (removes facial hair) Lymph nodes: Cervical, supraclavicular, and axillary nodes normal. No abnormal inguinal nodes palpated Neurologic: Grossly normal   Pelvic: External genitalia:  no lesions              Urethra:  normal appearing urethra with no masses, tenderness or lesions              Bartholins and Skenes: normal                 Vagina: normal appearing vagina with normal color and discharge, no lesions              Cervix: no lesions               Bimanual Exam:  Uterus:  normal size, contour, position, consistency, mobility, non-tender              Adnexa: no mass, fullness, tenderness               Rectovaginal: Confirms               Anus:  normal sphincter tone, no lesions  Chaperone was present for exam.  A:  Well Woman with normal exam  Oligomenorrhea  Acne  Hirsutism  Overweight  Picture c/w PCOS  Facial rash, will check ANA per Dermatologists recommendation    P:   Screening labs, std testing  Hirsutism labs   HgbA1C, TSH, Prolactin, vit D  Pap with reflex hpv, GC/CT  ANA for Dermatology  Discussed breast self exam  Discussed calcium and vit D intake  Provera W/D, call with or without a cycle (if pregnancy test is negative)  Start yaz with her cycle  Needs pill check in 3 months

## 2017-09-22 NOTE — Patient Instructions (Signed)
EXERCISE AND DIET:  We recommended that you start or continue a regular exercise program for good health. Regular exercise means any activity that makes your heart beat faster and makes you sweat.  We recommend exercising at least 30 minutes per day at least 3 days a week, preferably 4 or 5.  We also recommend a diet low in fat and sugar.  Inactivity, poor dietary choices and obesity can cause diabetes, heart attack, stroke, and kidney damage, among others.    ALCOHOL AND SMOKING:  Women should limit their alcohol intake to no more than 7 drinks/beers/glasses of wine (combined, not each!) per week. Moderation of alcohol intake to this level decreases your risk of breast cancer and liver damage. And of course, no recreational drugs are part of a healthy lifestyle.  And absolutely no smoking or even second hand smoke. Most people know smoking can cause heart and lung diseases, but did you know it also contributes to weakening of your bones? Aging of your skin?  Yellowing of your teeth and nails?  CALCIUM AND VITAMIN D:  Adequate intake of calcium and Vitamin D are recommended.  The recommendations for exact amounts of these supplements seem to change often, but generally speaking 600 mg of calcium (either carbonate or citrate) and 800 units of Vitamin D per day seems prudent. Certain women may benefit from higher intake of Vitamin D.  If you are among these women, your doctor will have told you during your visit.    PAP SMEARS:  Pap smears, to check for cervical cancer or precancers,  have traditionally been done yearly, although recent scientific advances have shown that most women can have pap smears less often.  However, every woman still should have a physical exam from her gynecologist every year. It will include a breast check, inspection of the vulva and vagina to check for abnormal growths or skin changes, a visual exam of the cervix, and then an exam to evaluate the size and shape of the uterus and  ovaries.  And after 28 years of age, a rectal exam is indicated to check for rectal cancers. We will also provide age appropriate advice regarding health maintenance, like when you should have certain vaccines, screening for sexually transmitted diseases, bone density testing, colonoscopy, mammograms, etc.   MAMMOGRAMS:  All women over 40 years old should have a yearly mammogram. Many facilities now offer a "3D" mammogram, which may cost around $50 extra out of pocket. If possible,  we recommend you accept the option to have the 3D mammogram performed.  It both reduces the number of women who will be called back for extra views which then turn out to be normal, and it is better than the routine mammogram at detecting truly abnormal areas.    COLONOSCOPY:  Colonoscopy to screen for colon cancer is recommended for all women at age 50.  We know, you hate the idea of the prep.  We agree, BUT, having colon cancer and not knowing it is worse!!  Colon cancer so often starts as a polyp that can be seen and removed at colonscopy, which can quite literally save your life!  And if your first colonoscopy is normal and you have no family history of colon cancer, most women don't have to have it again for 10 years.  Once every ten years, you can do something that may end up saving your life, right?  We will be happy to help you get it scheduled when you are ready.    Be sure to check your insurance coverage so you understand how much it will cost.  It may be covered as a preventative service at no cost, but you should check your particular policy.      Oral Contraception Information Oral contraceptive pills (OCPs) are medicines taken to prevent pregnancy. OCPs work by preventing the ovaries from releasing eggs. The hormones in OCPs also cause the cervical mucus to thicken, preventing the sperm from entering the uterus. The hormones also cause the uterine lining to become thin, not allowing a fertilized egg to attach to the  inside of the uterus. OCPs are highly effective when taken exactly as prescribed. However, OCPs do not prevent sexually transmitted diseases (STDs). Safe sex practices, such as using condoms along with the pill, can help prevent STDs. Before taking the pill, you may have a physical exam and Pap test. Your health care provider may order blood tests. The health care provider will make sure you are a good candidate for oral contraception. Discuss with your health care provider the possible side effects of the OCP you may be prescribed. When starting an OCP, it can take 2 to 3 months for the body to adjust to the changes in hormone levels in your body. Types of oral contraception  The combination pill-This pill contains estrogen and progestin (synthetic progesterone) hormones. The combination pill comes in 21-day, 28-day, or 91-day packs. Some types of combination pills are meant to be taken continuously (365-day pills). With 21-day packs, you do not take pills for 7 days after the last pill. With 28-day packs, the pill is taken every day. The last 7 pills are without hormones. Certain types of pills have more than 21 hormone-containing pills. With 91-day packs, the first 84 pills contain both hormones, and the last 7 pills contain no hormones or contain estrogen only.  The minipill-This pill contains the progesterone hormone only. The pill is taken every day continuously. It is very important to take the pill at the same time each day. The minipill comes in packs of 28 pills. All 28 pills contain the hormone. Advantages of oral contraceptive pills  Decreases premenstrual symptoms.  Treats menstrual period cramps.  Regulates the menstrual cycle.  Decreases a heavy menstrual flow.  May treatacne, depending on the type of pill.  Treats abnormal uterine bleeding.  Treats polycystic ovarian syndrome.  Treats endometriosis.  Can be used as emergency contraception. Things that can make oral  contraceptive pills less effective OCPs can be less effective if:  You forget to take the pill at the same time every day.  You have a stomach or intestinal disease that lessens the absorption of the pill.  You take OCPs with other medicines that make OCPs less effective, such as antibiotics, certain HIV medicines, and some seizure medicines.  You take expired OCPs.  You forget to restart the pill on day 7, when using the packs of 21 pills.  Risks associated with oral contraceptive pills Oral contraceptive pills can sometimes cause side effects, such as:  Headache.  Nausea.  Breast tenderness.  Irregular bleeding or spotting.  Combination pills are also associated with a small increased risk of:  Blood clots.  Heart attack.  Stroke.  This information is not intended to replace advice given to you by your health care provider. Make sure you discuss any questions you have with your health care provider. Document Released: 06/08/2002 Document Revised: 08/24/2015 Document Reviewed: 09/06/2012 Elsevier Interactive Patient Education  2018 [employer]  

## 2017-09-24 LAB — CYTOLOGY - PAP
CHLAMYDIA, DNA PROBE: NEGATIVE
NEISSERIA GONORRHEA: NEGATIVE
TRICH (WINDOWPATH): NEGATIVE

## 2017-09-26 LAB — DHEA-SULFATE: DHEA SO4: 188.3 ug/dL (ref 84.8–378.0)

## 2017-09-26 LAB — 17-HYDROXYPROGESTERONE: 17-Hydroxyprogesterone: 51 ng/dL

## 2017-09-26 LAB — HEP, RPR, HIV PANEL
HEP B S AG: NEGATIVE
HIV SCREEN 4TH GENERATION: NONREACTIVE
RPR Ser Ql: NONREACTIVE

## 2017-09-26 LAB — COMPREHENSIVE METABOLIC PANEL
ALT: 22 IU/L (ref 0–32)
AST: 32 IU/L (ref 0–40)
Albumin/Globulin Ratio: 1.7 (ref 1.2–2.2)
Albumin: 4.3 g/dL (ref 3.5–5.5)
Alkaline Phosphatase: 64 IU/L (ref 39–117)
BUN/Creatinine Ratio: 14 (ref 9–23)
BUN: 10 mg/dL (ref 6–20)
Bilirubin Total: 0.2 mg/dL (ref 0.0–1.2)
CALCIUM: 9.3 mg/dL (ref 8.7–10.2)
CO2: 26 mmol/L (ref 20–29)
Chloride: 103 mmol/L (ref 96–106)
Creatinine, Ser: 0.74 mg/dL (ref 0.57–1.00)
GFR, EST AFRICAN AMERICAN: 128 mL/min/{1.73_m2} (ref >59)
GFR, EST NON AFRICAN AMERICAN: 111 mL/min/{1.73_m2} (ref >59)
GLUCOSE: 88 mg/dL (ref 65–99)
Globulin, Total: 2.5 g/dL (ref 1.5–4.5)
POTASSIUM: 4.1 mmol/L (ref 3.5–5.2)
Sodium: 141 mmol/L (ref 134–144)
TOTAL PROTEIN: 6.8 g/dL (ref 6.0–8.5)

## 2017-09-26 LAB — VITAMIN D 25 HYDROXY (VIT D DEFICIENCY, FRACTURES): Vit D, 25-Hydroxy: 24.6 ng/mL — ABNORMAL LOW (ref 30.0–100.0)

## 2017-09-26 LAB — LIPID PANEL
CHOLESTEROL TOTAL: 197 mg/dL (ref 100–199)
Chol/HDL Ratio: 3.3 ratio (ref 0.0–4.4)
HDL: 60 mg/dL (ref >39)
LDL CALC: 110 mg/dL — AB (ref 0–99)
TRIGLYCERIDES: 133 mg/dL (ref 0–149)
VLDL CHOLESTEROL CAL: 27 mg/dL (ref 5–40)

## 2017-09-26 LAB — TESTOSTERONE: Testosterone: 40 ng/dL (ref 8–48)

## 2017-09-26 LAB — BETA HCG QUANT (REF LAB)

## 2017-09-26 LAB — CBC
Hematocrit: 40 % (ref 34.0–46.6)
Hemoglobin: 13.3 g/dL (ref 11.1–15.9)
MCH: 31.4 pg (ref 26.6–33.0)
MCHC: 33.3 g/dL (ref 31.5–35.7)
MCV: 94 fL (ref 79–97)
PLATELETS: 294 10*3/uL (ref 150–450)
RBC: 4.24 x10E6/uL (ref 3.77–5.28)
RDW: 13.2 % (ref 12.3–15.4)
WBC: 5 10*3/uL (ref 3.4–10.8)

## 2017-09-26 LAB — HEPATITIS C ANTIBODY

## 2017-09-26 LAB — ANA: ANA: NEGATIVE

## 2017-09-26 LAB — PROLACTIN: Prolactin: 5.5 ng/mL (ref 4.8–23.3)

## 2017-09-26 LAB — TSH: TSH: 0.723 u[IU]/mL (ref 0.450–4.500)

## 2017-09-26 LAB — FOLLICLE STIMULATING HORMONE: FSH: 6.1 m[IU]/mL

## 2017-09-26 LAB — HEMOGLOBIN A1C
ESTIMATED AVERAGE GLUCOSE: 100 mg/dL
Hgb A1c MFr Bld: 5.1 % (ref 4.8–5.6)

## 2017-09-26 LAB — ESTRADIOL: Estradiol: 35.6 pg/mL

## 2017-09-28 ENCOUNTER — Encounter: Payer: Self-pay | Admitting: Obstetrics and Gynecology

## 2017-09-29 ENCOUNTER — Telehealth: Payer: Self-pay

## 2017-09-29 ENCOUNTER — Encounter: Payer: Self-pay | Admitting: Physician Assistant

## 2017-09-29 DIAGNOSIS — R87612 Low grade squamous intraepithelial lesion on cytologic smear of cervix (LGSIL): Secondary | ICD-10-CM

## 2017-09-29 NOTE — Telephone Encounter (Signed)
See current phone note. Will address skin concerns when speaking to patient regarding lab results.  

## 2017-09-29 NOTE — Telephone Encounter (Signed)
See current phone note. Will address skin concerns when speaking to patient regarding lab results.

## 2017-09-29 NOTE — Telephone Encounter (Signed)
See multiple My Chart messages sent from patient. These will be addressed by phone upon reaching patient.

## 2017-09-29 NOTE — Telephone Encounter (Signed)
Attempted to reach patient. There was no answer and voicemail box is full. If I am unavailable please see if [name] can speak with the patient as she needs a colpo.  Notes recorded by Romualdo BolkJertson, Jill Evelyn, MD on 09/25/2017 at 4:39 PM EDT Please call the patient, her pap returned with LSIL. Please set her up for a colposcopy. The cervical cultures were negative, her ANA is negative. The 17 Hydroxyprogesterone (androgen) is still pending. ------  Notes recorded by Romualdo BolkJertson, Jill Evelyn, MD on 09/23/2017 at 10:35 AM EDT Results and any recommendations were sent via MyChart.   Hi Samantha Hernandez, Your not pregnant and can start your provera today. Your vit d is slightly low. You should start taking an extra 1,000 IU a day of vit D3 every day (long term). Your LDL cholesterol is minimally elevated, but the rest of your lipid panel is normal.  One of the androgen levels, the ANA and your pap/cervical cultures are pending. The rest of your blood work is normal. Please call the office with any questions or concerns.

## 2017-09-30 ENCOUNTER — Encounter: Payer: Self-pay | Admitting: Physician Assistant

## 2017-09-30 ENCOUNTER — Emergency Department (HOSPITAL_BASED_OUTPATIENT_CLINIC_OR_DEPARTMENT_OTHER)
Admission: EM | Admit: 2017-09-30 | Discharge: 2017-10-01 | Disposition: A | Discharge status: Home / Self Care | Payer: Managed Care, Other (non HMO) | Attending: Emergency Medicine | Admitting: Emergency Medicine

## 2017-09-30 ENCOUNTER — Other Ambulatory Visit: Payer: Self-pay

## 2017-09-30 ENCOUNTER — Encounter (HOSPITAL_BASED_OUTPATIENT_CLINIC_OR_DEPARTMENT_OTHER): Payer: Self-pay

## 2017-09-30 ENCOUNTER — Emergency Department (HOSPITAL_BASED_OUTPATIENT_CLINIC_OR_DEPARTMENT_OTHER): Payer: Managed Care, Other (non HMO)

## 2017-09-30 DIAGNOSIS — F419 Anxiety disorder, unspecified: Secondary | ICD-10-CM | POA: Diagnosis not present

## 2017-09-30 DIAGNOSIS — F332 Major depressive disorder, recurrent severe without psychotic features: Secondary | ICD-10-CM | POA: Insufficient documentation

## 2017-09-30 DIAGNOSIS — Z79899 Other long term (current) drug therapy: Secondary | ICD-10-CM | POA: Insufficient documentation

## 2017-09-30 DIAGNOSIS — F411 Generalized anxiety disorder: Secondary | ICD-10-CM | POA: Insufficient documentation

## 2017-09-30 DIAGNOSIS — F422 Mixed obsessional thoughts and acts: Secondary | ICD-10-CM | POA: Insufficient documentation

## 2017-09-30 DIAGNOSIS — R0602 Shortness of breath: Secondary | ICD-10-CM | POA: Diagnosis present

## 2017-09-30 HISTORY — DX: Panic disorder (episodic paroxysmal anxiety): F41.0

## 2017-09-30 NOTE — ED Provider Notes (Signed)
[facility] EMERGENCY DEPARTMENT Provider Note   CSN: [id number] Arrival date & time: 09/30/17  2046     History   Chief Complaint Chief Complaint  Patient presents with  . Anxiety    HPI Samantha Hernandez is a 27 y.o. female.  27 yo F with a chief complaint of shortness of breath.  The patient feels that this is been an ongoing issue but worsened today when she began to run in place for exercise.  After that she felt that her heart started racing and she felt like she was having trouble breathing.  Feels that she cannot take a deep breath.  Feels that this is been an ongoing issue and she has been having trouble exerting herself at home.  Denies chest pain or fever.  Feels that she will suddenly drop dead.   She also is concerned about her right leg, and her face, and thinks she may have diabetes.  She also thinks that she has skin cancer and is concerned that she has seen two dermatologists and neither was willing to do a biopsy.  She also feels that she may have type of autoimmune disease.    The history is provided by the patient.  Anxiety  This is a recurrent problem. The current episode started more than 1 week ago. The problem occurs every several days. The problem has been gradually worsening. Associated symptoms include shortness of breath. Pertinent negatives include no chest pain, no abdominal pain and no headaches. The symptoms are aggravated by walking. Nothing relieves the symptoms. She has tried nothing for the symptoms. The treatment provided no relief.    Past Medical History:  Diagnosis Date  . Acne   . ADD (attention deficit disorder)   . Allergy   . Amenorrhea   . Anxiety   . Hormone disorder   . Insomnia   . Panic attack   . Reactive depression   . Tinnitus     Patient Active Problem List   Diagnosis Date Noted  . ADD (attention deficit disorder) 07/19/2011    History reviewed. No pertinent surgical history.   OB History    Gravida  0   Para  0   Term  0   Preterm  0   AB  0   Living  0     SAB  0   TAB  0   Ectopic  0   Multiple  0   Live Births  0            Home Medications    Prior to Admission medications   Medication Sig Start Date End Date Taking? Authorizing Provider  ASTAXANTHIN PO Take 12 mg by mouth daily.    [provider]  b complex vitamins capsule Take 1 capsule by mouth daily.    [provider]  cholecalciferol (VITAMIN D) 1000 units tablet Take 1,000 Units by mouth 2 (two) times daily.    [provider]  drospirenone-ethinyl estradiol (YAZ,GIANVI,LORYNA) 3-0.02 MG tablet Take 1 tablet by mouth daily. Start on the first day of your cycle after taking the provera. 09/22/17   Romualdo Bolk, MD  ibuprofen (ADVIL,MOTRIN) 800 MG tablet  07/01/17   [provider]  medroxyPROGESTERone (PROVERA) 5 MG tablet Take 1 tablet (5 mg total) by mouth daily. 09/22/17   Romualdo Bolk, MD  Omega-3 Fatty Acids (FISH OIL) 1200 MG CAPS Take by mouth.    [provider]  Polypodium Leucotomos (HELIOCARE  PO) Take by mouth.    [provider]  Zinc 50 MG CAPS Take by mouth.    [provider]    Family History Family History  Problem Relation Age of Onset  . Anxiety disorder Mother   . Depression Father   . Anxiety disorder Sister   . Bipolar disorder Paternal Aunt   . Diabetes Paternal Aunt   . Bipolar disorder Cousin   . Diabetes Paternal Grandfather     Social History Social History   Tobacco Use  . Smoking status: Never Smoker  . Smokeless tobacco: Never Used  Substance Use Topics  . Alcohol use: No  . Drug use: No     Allergies   Patient has no known allergies.   Review of Systems Review of Systems  Constitutional: Negative for chills and fever.  HENT: Negative for congestion and rhinorrhea.   Eyes: Negative for redness and visual disturbance.  Respiratory: Positive for shortness of breath. Negative for  wheezing.   Cardiovascular: Negative for chest pain and palpitations.  Gastrointestinal: Negative for abdominal pain, nausea and vomiting.  Genitourinary: Negative for dysuria and urgency.  Musculoskeletal: Negative for arthralgias and myalgias.  Skin: Negative for pallor and wound.  Neurological: Negative for dizziness and headaches.     Physical Exam Updated Vital Signs BP 130/84 (BP Location: Right Arm)   Pulse 80   Temp 98.6 F (37 C) (Oral)   Resp 18   Ht 5\' 8"  (1.727 m)   Wt 90.6 kg (199 lb 11.8 oz)   LMP  (LMP Unknown)   SpO2 100%   BMI 30.37 kg/m   Physical Exam  Constitutional: She is oriented to person, place, and time. She appears well-developed and well-nourished. No distress.  HENT:  Head: Normocephalic and atraumatic.  Acne to cheeks and chin  Eyes: Pupils are equal, round, and reactive to light. EOM are normal.  Neck: Normal range of motion. Neck supple.  Cardiovascular: Normal rate and regular rhythm. Exam reveals no gallop and no friction rub.  No murmur heard. Pulmonary/Chest: Effort normal. She has no wheezes. She has no rales.  Abdominal: Soft. She exhibits no distension. There is no tenderness.  Musculoskeletal: She exhibits no edema or tenderness.  Neurological: She is alert and oriented to person, place, and time.  Skin: Skin is warm and dry. She is not diaphoretic.  Psychiatric: Her mood appears anxious. She is agitated. Thought content is paranoid.  Nursing note and vitals reviewed.    ED Treatments / Results  Labs (all labs ordered are listed, but only abnormal results are displayed) Labs Reviewed - No data to display  EKG EKG Interpretation  Date/Time:  Tuesday September 30 2017 20:56:35 EDT Ventricular Rate:  96 PR Interval:  130 QRS Duration: 88 QT Interval:  350 QTC Calculation: 442 R Axis:   69 Text Interpretation:  Normal sinus rhythm Normal ECG No old tracing to compare Confirmed by Melene PlanFloyd, Ryann Leavitt [phone]) on 09/30/2017 9:16:04 PM Also  confirmed by Melene PlanFloyd, Leroi Haque [phone]), editor Samantha Hairassel, Kerry [phone])  on 10/01/2017 6:57:52 AM   Radiology Dg Chest 2 View  Result Date: 09/30/2017 CLINICAL DATA:  Shortness of breath and heart racing after a cardio workout at 7 p.m. Patient wakes in the night because she can't breathe for several months. EXAM: CHEST - 2 VIEW COMPARISON:  None. FINDINGS: The heart size and mediastinal contours are within normal limits. Both lungs are clear. The visualized skeletal structures are unremarkable. IMPRESSION: No active cardiopulmonary disease. Electronically Signed  By: Burman Nieves M.D.   On: 09/30/2017 22:39    Procedures Procedures (including critical care time)  Medications Ordered in ED Medications - No data to display   Initial Impression / Assessment and Plan / ED Course  I have reviewed the triage vital signs and the nursing notes.  Pertinent labs & imaging results that were available during my care of the patient were reviewed by me and considered in my medical decision making (see chart for details).     27 yo F with a chief complaint of shortness of breath.  This been going on for the past couple months.  She states she cannot sleep at night because she feels like she is having trouble breathing.  Vital signs here are stable.  Chest x-ray without finding.  Clear lung sounds on my exam.  Patient appears very anxious, she has expressed that she has no fear of dying, has flights of ideas, feels depressed and thinks that she would be better if she were no longer alive.  I feel she is medically clear.  She had labs done less than a week ago.  I doubt she has a sudden anemia or electrolyte abnormality making her feel short of breath.  I think more likely the patient has an underlying psychiatric disorder that is causing her to have these issues.  Seems to be worsening based on the multiple telephone notes in the system.  I offered to have TTS evaluate and she is willing to have a discussion with  them.   TTS feels safe for outpatient follow up.    I have discussed the diagnosis/risks/treatment options with the patient and believe the pt to be eligible for discharge home to follow-up with PCP. We also discussed returning to the ED immediately if new or worsening sx occur. We discussed the sx which are most concerning (e.g., sudden worsening pain, fever, inability to tolerate by mouth) that necessitate immediate return. Medications administered to the patient during their visit and any new prescriptions provided to the patient are listed below.  Medications given during this visit Medications - No data to display    The patient appears reasonably screen and/or stabilized for discharge and I doubt any other medical condition or other [facility] requiring further screening, evaluation, or treatment in the ED at this time prior to discharge.        Final Clinical Impressions(s) / ED Diagnoses   Final diagnoses:  Shortness of breath    ED Discharge Orders    None       Melene Plan, DO 10/01/17 1918

## 2017-09-30 NOTE — BH Assessment (Addendum)
Tele Assessment Note   Patient Name: Samantha Hernandez MRN: [id number] Referring Physician: Melene Plan Location of Patient: [facility] Location of Provider: Behavioral Health TTS Department  Samantha Hernandez is an 27 y.o. female.  The pt came in due to multiple medical complaints.  The pt fears that she has skin cancer, diabetes and a problem with her right leg.  According to previous providers notes, the pt does not have these ailments.  She stated she can not stop thinking about what is wrong with her body.  The pt also stated she has trouble breathing.  She initially stated, "at this point, passing out will be better.  The pt stated she doesn't want to live and then denied that she wanted to kill herself.  The pt denies ever making any suicide attempts in the past.  The pt has a family history of anxiety (sister).  The pt stated, "my grandmother will just make up medical problems.  My mother says I'm just like her, but I'm not."  She is currently not seeing a Veterinary surgeon.  The pt saw a counselor once at [facility]. 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  She has missed 2 days of work due to her anxiety.  The pt denies self harm, HI, legal issues, abuse and hallucinations.  She is sleeping about 3 hours a night.  The pt denied any manic symptoms, such as doing risky behaviors.  The pt stated she doesn't have an appetite and is often nauseous.  She stated she often isolates, is irritable, has trouble concentrating, and has little interest in doing pleasurable things.  The pt denies substance use.   Pt is dressed in a hospital gown.  She is alert and oriented x4. Pt speaks in a clear tone, at a loud volume and fast pace. Eye contact was good. Pt's mood is anxious. Thought process is coherent and relevant. There is no indication Pt is currently responding to internal stimuli or experiencing delusional thought content.?Pt was cooperative throughout assessment.     Diagnosis: F33.2 Major  depressive disorder, Recurrent episode, Severe  F41.1 Generalized anxiety disorder F42 Obsessive-compulsive disorder  Past Medical History:  Past Medical History:  Diagnosis Date  . Acne   . ADD (attention deficit disorder)   . Allergy   . Amenorrhea   . Anxiety   . Hormone disorder   . Insomnia   . Panic attack   . Reactive depression   . Tinnitus     History reviewed. No pertinent surgical history.  Family History:  Family History  Problem Relation Age of Onset  . Anxiety disorder Mother   . Depression Father   . Anxiety disorder Sister   . Bipolar disorder Paternal Aunt   . Diabetes Paternal Aunt   . Bipolar disorder Cousin   . Diabetes Paternal Grandfather     Social History:  reports that she has never smoked. She has never used smokeless tobacco. She reports that she does not drink alcohol or use drugs.  Additional Social History:  Alcohol / Drug Use Pain Medications: See MAR Prescriptions: See MAR Over the Counter: See MAR History of alcohol / drug use?: No history of alcohol / drug abuse Longest period of sobriety (when/how long): NA  CIWA: CIWA-Ar BP: 130/84 Pulse Rate: 80 COWS:    Allergies: No Known Allergies  Home Medications:  (Not in a hospital admission)  OB/GYN Status:  No LMP recorded (lmp unknown). (Menstrual status: Irregular  Periods).  General Assessment Data Location of Assessment: ([facility]) TTS Assessment: In system Is this a Tele or Face-to-Face Assessment?: Tele Assessment Is this an Initial Assessment or a Re-assessment for this encounter?: Initial Assessment Marital status: Single Maiden name: Gagan Is patient pregnant?: No Pregnancy Status: No Living Arrangements: Parent Can pt return to current living arrangement?: Yes Admission Status: Voluntary Is patient capable of signing voluntary admission?: Yes Referral Source: Self/Family/Friend Insurance type: Medical sales representative     Crisis Care Plan Living Arrangements:  Parent Legal Guardian: Other:(Self) Name of Psychiatrist: none Name of Therapist: none  Education Status Is patient currently in school?: No Is the patient employed, unemployed or receiving disability?: Employed  Risk to self with the past 6 months Suicidal Ideation: Yes-Currently Present Has patient been a risk to self within the past 6 months prior to admission? : Yes Suicidal Intent: Yes-Currently Present Has patient had any suicidal intent within the past 6 months prior to admission? : No Is patient at risk for suicide?: Yes Suicidal Plan?: No Has patient had any suicidal plan within the past 6 months prior to admission? : No Access to Means: No What has been your use of drugs/alcohol within the last 12 months?: none Previous Attempts/Gestures: No How many times?: 0 Other Self Harm Risks: none Triggers for Past Attempts: None known Intentional Self Injurious Behavior: None Family Suicide History: No Recent stressful life event(s): Recent negative physical changes(problems with skin) Persecutory voices/beliefs?: No Depression: Yes Depression Symptoms: Insomnia, Isolating, Fatigue, Guilt, Loss of interest in usual pleasures, Feeling worthless/self pity, Feeling angry/irritable Substance abuse history and/or treatment for substance abuse?: No Suicide prevention information given to non-admitted patients: Yes  Risk to Others within the past 6 months Homicidal Ideation: No Does patient have any lifetime risk of violence toward others beyond the six months prior to admission? : No Thoughts of Harm to Others: No Current Homicidal Intent: No Current Homicidal Plan: No Access to Homicidal Means: No Identified Victim: none History of harm to others?: No Assessment of Violence: None Noted Violent Behavior Description: none Does patient have access to weapons?: No Criminal Charges Pending?: No Does patient have a court date: No Is patient on probation?:  No  Psychosis Hallucinations: None noted Delusions: None noted  Mental Status Report Appearance/Hygiene: In hospital gown, Unremarkable, Other (Comment)(has white marks around nose) Eye Contact: Good Motor Activity: Unable to assess Speech: Logical/coherent, Rapid Level of Consciousness: Alert Mood: Depressed, Anxious Affect: Anxious, Depressed Anxiety Level: Severe Thought Processes: Coherent, Relevant, Tangential Judgement: Partial Orientation: Person, Place, Time, Situation, Appropriate for developmental age Obsessive Compulsive Thoughts/Behaviors: Severe  Cognitive Functioning Concentration: Normal Memory: Recent Intact, Remote Intact Is patient IDD: No Is patient DD?: No Insight: Fair Impulse Control: Fair Appetite: Poor Have you had any weight changes? : No Change Sleep: Decreased Total Hours of Sleep: 3 Vegetative Symptoms: None  ADLScreening [facility] Assessment Services) Patient's cognitive ability adequate to safely complete daily activities?: Yes Patient able to express need for assistance with ADLs?: Yes Independently performs ADLs?: Yes (appropriate for developmental age)  Prior Inpatient Therapy Prior Inpatient Therapy: No  Prior Outpatient Therapy Prior Outpatient Therapy: Yes Prior Therapy Dates: 2001 and once in 2019 Prior Therapy Facilty/Provider(s): Pt can't remember and once at [facility] Reason for Treatment: anxiety Does patient have an ACCT team?: No Does patient have Intensive In-House Services?  : No Does patient have [facility] services? : No Does patient have P4CC services?: No  ADL Screening (condition at time of admission) Patient's cognitive  ability adequate to safely complete daily activities?: Yes Patient able to express need for assistance with ADLs?: Yes Independently performs ADLs?: Yes (appropriate for developmental age)       Abuse/Neglect Assessment (Assessment to be complete while patient is alone) Abuse/Neglect Assessment Can Be  Completed: Yes Physical Abuse: Denies Verbal Abuse: Denies Sexual Abuse: Denies Exploitation of patient/patient's resources: Denies Self-Neglect: Denies Values / Beliefs Cultural Requests During Hospitalization: None Spiritual Requests During Hospitalization: None Consults Spiritual Care Consult Needed: No Social Work Consult Needed: No      Additional Information 1:1 In Past 12 Months?: No CIRT Risk: No Elopement Risk: No Does patient have medical clearance?: Yes     Disposition:  Disposition Initial Assessment Completed for this Encounter: Yes Disposition of Patient: (pending NP decision)   NP Nira ConnJason Berry recommends the pt follow up with outpatient resources.  RN Tresa EndoKelly and MD Adela LankFloyd were made aware.  This service was provided via telemedicine using a 2-way, interactive audio and video technology.  Names of all persons participating in this telemedicine service and their role in this encounter. Name: Linus GalasKatie Graf Role: Pt  Name: Riley ChurchesKendall Birl Lobello Role: TTS  Name:  Role:   Name:  Role:     Ottis StainGarvin, Nohely Whitehorn Jermaine 09/30/2017 11:57 PM

## 2017-09-30 NOTE — ED Notes (Addendum)
Pt SpO2 96-100% and heart rate 110, Pt stated she felt lightheaded.

## 2017-09-30 NOTE — Telephone Encounter (Signed)
Attempted to reach patient. There was no answer and voicemail box is full. If I am unavailable please see if [name] can speak with the patient as she needs a colpo.

## 2017-09-30 NOTE — Telephone Encounter (Addendum)
Spoke with patient. Advised of all results as seen below. Brief explanation of colpo provided. Patient states she completed Provera a few days ago, unsure of exact date, menses has not started to date. Advised patient to continue to monitor, can take up to 2 wks for menses to start after completion of Provera. May start OCP with menses, return call to office if no menses in 2wks.   Advised patient to return call to office on first day of menses to schedule colpo.   Order placed for colpo. Patient aware will be called with benefits prior to procedure.   Patient verbalizes understanding and is agreeable. Length of call .   Routing to provider for final review. Patient is agreeable to disposition. Will close encounter.

## 2017-09-30 NOTE — ED Notes (Signed)
Patient transported to X-ray 

## 2017-09-30 NOTE — ED Triage Notes (Addendum)
Pt c/o SOB, heart racing after 15 min cardio workout approx 7pm-pt NAD-tearful-pt states she has been waking in the night "because I can't breathe" x months-pt anxious with fast speech-no resp distress noted

## 2017-10-01 ENCOUNTER — Encounter: Payer: Self-pay | Admitting: Physician Assistant

## 2017-10-02 ENCOUNTER — Encounter: Payer: Self-pay | Admitting: Physician Assistant

## 2017-10-03 ENCOUNTER — Telehealth: Payer: Self-pay | Admitting: Obstetrics and Gynecology

## 2017-10-03 NOTE — Telephone Encounter (Signed)
Unable to leave a message voicemail is full. Calling to convey benefits.

## 2017-10-07 ENCOUNTER — Encounter: Payer: Self-pay | Admitting: Physician Assistant

## 2017-10-08 ENCOUNTER — Ambulatory Visit (INDEPENDENT_AMBULATORY_CARE_PROVIDER_SITE_OTHER): Payer: 59 | Admitting: Psychology

## 2017-10-08 DIAGNOSIS — F429 Obsessive-compulsive disorder, unspecified: Secondary | ICD-10-CM

## 2017-10-08 DIAGNOSIS — F321 Major depressive disorder, single episode, moderate: Secondary | ICD-10-CM

## 2017-10-08 NOTE — Telephone Encounter (Signed)
Unable to leave a message voicemail is full. Call placed to convey benefits.

## 2017-10-09 ENCOUNTER — Inpatient Hospital Stay: Payer: Managed Care, Other (non HMO) | Admitting: Physician Assistant

## 2017-10-09 ENCOUNTER — Telehealth: Payer: Self-pay | Admitting: Obstetrics and Gynecology

## 2017-10-09 NOTE — Telephone Encounter (Signed)
Call placed to convey benefits. 

## 2017-10-10 ENCOUNTER — Ambulatory Visit (INDEPENDENT_AMBULATORY_CARE_PROVIDER_SITE_OTHER): Payer: Managed Care, Other (non HMO) | Admitting: Allergy

## 2017-10-10 ENCOUNTER — Encounter: Payer: Self-pay | Admitting: Allergy

## 2017-10-10 VITALS — BP 108/62 | HR 113 | Temp 98.0°F | Resp 22 | Ht 66.7 in | Wt 194.2 lb

## 2017-10-10 DIAGNOSIS — J310 Chronic rhinitis: Secondary | ICD-10-CM

## 2017-10-10 DIAGNOSIS — T781XXA Other adverse food reactions, not elsewhere classified, initial encounter: Secondary | ICD-10-CM

## 2017-10-10 MED ORDER — EPINEPHRINE 0.3 MG/0.3ML IJ SOAJ: 0.3000 mg | Freq: Once | INTRAMUSCULAR | 2 refills | Status: AC

## 2017-10-10 NOTE — Patient Instructions (Addendum)
Adverse food reaction   - based on your history of symptoms after ingestion of peanut, dairy and bread will obtain serum IgE levels to these foods (peanut, milk/casein, wheat/gluten products,      Will call you with these results.      Recommend avoiding this foods until labs return   - due to symptoms involving multiple organ systems (skin, GI) with peanut ingestion would recommend you have access to an epinephrine device (Auviq) in case of accidental ingestion resulting in severe allergic reaction.   Follow emergency action plan in case of reaction.    Environmental allergy   - symptoms of nasal congestion and HA can be triggered by environmental allergens   - will obtain environmental allergen profile   - continue zyrtec 10mg  daily as needed   - for nasal congestion recommend use of OTC nasal steroid like Flonase, Nasacort or Rhinocort 2 sprays each nostril daily for 1-2 weeks at a time until improved  Follow-up 6 months or sooner if needed

## 2017-10-10 NOTE — Progress Notes (Signed)
New Patient Note  RE: Samantha Hernandez MRN: [id number] DOB: 08/29/1990 Date of Office Visit: 10/10/2017  Referring provider: Larkin InaWeber, Sarah L, PA-C Primary care provider: Morrell RiddleWeber, Sarah L, PA-C  Chief Complaint: feels she has a food allergy  History of present illness: Samantha Hernandez is a 27 y.o. female presenting today for consultation for concern for food allergy.   She states she does not feel well overall and has been seeing her PCP for generalized symptoms.   She does feel that she has an allergy to certain foods. With peanut ingestion she reports developing hot flashes, facial swelling, nausea pretty quickly after ingestion.  She states she first noticed symptoms related to peanut ingestion about a year ago.  She no longer eats any peanut products.  Able to eat tree nuts without issues.   With milk ingestion she reports she gets hot flashes after ingestion.  She notices when she eats white bread she develop abdominal pain, bloating.   She states she does not have enough money to buy her own food thus she reports she eats a lot of sugary foods which she feels is making her overall not feel well.    She also reports having nasal congestion and headache that she has had for the past several months.  She does take Zyrtec for the symptoms which she does help somewhat.  She last took her Zyrtec yesterday.  She has no history of eczema or asthma.  She states she currently has a rash on her face after using a harsh chemical scrub on her face.  She applies sunscreen when she has any outdoor exposure to this rash.  Review of systems: Review of Systems  Constitutional: Positive for malaise/fatigue. Negative for chills and fever.  HENT: Positive for congestion. Negative for ear discharge, ear pain, nosebleeds, sinus pain and sore throat.   Eyes: Negative for pain, discharge and redness.  Respiratory: Negative for cough, shortness of breath and wheezing.   Cardiovascular: Negative for chest pain.    Gastrointestinal: Positive for abdominal pain. Negative for constipation, diarrhea, nausea and vomiting.  Musculoskeletal: Negative for joint pain.  Skin: Positive for rash. Negative for itching.  Neurological: Positive for headaches. Negative for dizziness.    All other systems negative unless noted above in HPI  Past medical history: Past Medical History:  Diagnosis Date  . Acne   . ADD (attention deficit disorder)   . Allergy   . Amenorrhea   . Anxiety   . Hormone disorder   . Insomnia   . Panic attack   . Reactive depression   . Tinnitus   . Urticaria     Past surgical history: History reviewed. No pertinent surgical history.  Family history:  Family History  Problem Relation Age of Onset  . Anxiety disorder Mother   . Depression Father   . Allergic rhinitis Father   . Anxiety disorder Sister   . Allergic rhinitis Sister   . Bipolar disorder Paternal Aunt   . Diabetes Paternal Aunt   . Bipolar disorder Cousin   . Diabetes Paternal Grandfather   . Asthma Neg Hx   . Eczema Neg Hx   . Urticaria Neg Hx     Social history: Lives in a home with carpeting with electric heating and central cooling.  There is a cat in the home.  She denies a smoking history. 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 Always use your most recent med list.          ASTAXANTHIN PO Take 12 mg by mouth daily.   Azelaic Acid 15 % cream   b complex vitamins capsule Take 1 capsule by mouth daily.   cholecalciferol 1000 units tablet Commonly known as:  VITAMIN D Take 1,000 Units by mouth 2 (two) times daily.   drospirenone-ethinyl estradiol 3-0.02 MG tablet Commonly known as:  YAZ,GIANVI,LORYNA Take 1 tablet by mouth daily. Start on the first day of your cycle after taking the provera.    EPINEPHrine 0.3 mg/0.3 mL Soaj injection Commonly known as:  AUVI-Q Inject 0.3 mLs (0.3 mg total) into the muscle once for 1 dose. As directed for life-threatening allergic reactions   Fish Oil 1200 MG Caps Take by mouth.   HELIOCARE PO Take by mouth.   ibuprofen 800 MG tablet Commonly known as:  ADVIL,MOTRIN   Zinc 50 MG Caps Take by mouth.       Known medication allergies: No Known Allergies   Physical examination: Blood pressure 108/62, pulse (!) 113, temperature 98 F (36.7 C), temperature source Oral, resp. rate (!) 22, height 5' 6.7" (1.694 m), weight 194 lb 3.2 oz (88.1 kg), SpO2 97 %.  General: Alert, interactive, in no acute distress. HEENT: PERRLA, TMs pearly gray, turbinates mildly edematous without discharge, post-pharynx non erythematous. Neck: Supple without lymphadenopathy. Lungs: Clear to auscultation without wheezing, rhonchi or rales. {no increased work of breathing. CV: Normal S1, S2 without murmurs. Abdomen: Nondistended, nontender. Skin: White comedones with an erythematous base on the lower half of her face and clustered on her chin.  She has applied a white-cream to the lower half of her face. Extremities:  No clubbing, cyanosis or edema. Neuro:   Grossly intact.  Diagnositics/Labs:  Allergy testing: Unable to perform due to recent antihistamine use  Assessment and plan:   Adverse food reaction   - based on your history of symptoms after ingestion of peanut, dairy and bread will obtain serum IgE levels to these foods (peanut, milk/casein, wheat/gluten products).       Will call you with these results.      Recommend avoiding this foods until labs return   - due to symptoms involving multiple organ systems (skin, GI) with peanut ingestion would recommend you have access to an epinephrine device (Auviq) in case of accidental ingestion resulting in severe allergic reaction.   Follow emergency action plan in case of reaction.    Environmental  allergy   - symptoms of nasal congestion and HA can be triggered by environmental allergens   - will obtain environmental allergen profile   - continue zyrtec 10mg  daily as needed   - for nasal congestion recommend use of OTC nasal steroid like Flonase, Nasacort or Rhinocort 2 sprays each nostril daily for 1-2 weeks at a time until improved  Follow-up 6 months or sooner if needed  I appreciate the opportunity to take part in Joleena's care. Please do not hesitate to contact me with questions.  Sincerely,   Margo Aye, MD Allergy/Immunology [facility]

## 2017-10-13 LAB — ALLERGENS, ZONE 2
AMER SYCAMORE IGE QN: 0.14 kU/L — AB
Alternaria Alternata IgE: <0.1 kU/L
Aspergillus Fumigatus IgE: <0.1 kU/L
Bahia Grass IgE: <0.1 kU/L
Cat Dander IgE: <0.1 kU/L
Cedar, Mountain IgE: 0.15 kU/L — AB
Cladosporium Herbarum IgE: <0.1 kU/L
Common Silver Birch IgE: <0.1 kU/L
D Farinae IgE: <0.1 kU/L
D Pteronyssinus IgE: <0.1 kU/L
Dog Dander IgE: <0.1 kU/L
Hickory, White IgE: <0.1 kU/L
Johnson Grass IgE: <0.1 kU/L
Mucor Racemosus IgE: <0.1 kU/L
Mugwort IgE Qn: <0.1 kU/L
Pigweed, Rough IgE: <0.1 kU/L
Ragweed, Short IgE: 0.16 kU/L — AB
SWEET GUM IGE RAST QL: 0.11 kU/L — AB
Stemphylium Herbarum IgE: <0.1 kU/L
T001-IGE MAPLE/BOX ELDER: 0.12 kU/L — AB
T008-IGE ELM, AMERICAN: 0.21 kU/L — AB
Timothy Grass IgE: <0.1 kU/L
W009-IGE PLANTAIN, ENGLISH: 0.13 kU/L — AB
White Mulberry IgE: <0.1 kU/L

## 2017-10-13 LAB — MILK COMPONENT PANEL
F076-IgE Alpha Lactalbumin: <0.1 kU/L
F078-IgE Casein: <0.1 kU/L

## 2017-10-13 LAB — ALLERGEN, WHEAT, F4: Wheat IgE: <0.1 kU/L

## 2017-10-13 LAB — ALLERGEN,OAT,F7: Allergen Oat IgE: <0.1 kU/L

## 2017-10-13 LAB — ALLERGEN, PEANUT F13: PEANUT IGE: 0.24 kU/L — AB

## 2017-10-13 LAB — ALLERGEN MILK: Milk IgE: <0.1 kU/L

## 2017-10-13 LAB — ALLERGEN, RYE, F5

## 2017-10-13 LAB — ALLERGEN BARLEY F6

## 2017-10-15 ENCOUNTER — Ambulatory Visit: Payer: Self-pay | Admitting: Psychology

## 2017-10-16 ENCOUNTER — Other Ambulatory Visit: Payer: Self-pay

## 2017-10-16 ENCOUNTER — Encounter: Payer: Self-pay | Admitting: Physician Assistant

## 2017-10-16 ENCOUNTER — Ambulatory Visit (INDEPENDENT_AMBULATORY_CARE_PROVIDER_SITE_OTHER): Payer: Managed Care, Other (non HMO) | Admitting: Physician Assistant

## 2017-10-16 VITALS — BP 124/62 | HR 111 | Temp 98.2°F | Resp 18 | Ht 66.7 in | Wt 196.2 lb

## 2017-10-16 DIAGNOSIS — F411 Generalized anxiety disorder: Secondary | ICD-10-CM

## 2017-10-16 DIAGNOSIS — R238 Other skin changes: Secondary | ICD-10-CM | POA: Diagnosis not present

## 2017-10-16 DIAGNOSIS — L700 Acne vulgaris: Secondary | ICD-10-CM

## 2017-10-16 DIAGNOSIS — G4700 Insomnia, unspecified: Secondary | ICD-10-CM | POA: Diagnosis not present

## 2017-10-16 NOTE — Patient Instructions (Addendum)
Coconut oil for moisture  Sunscreen - PABA free, no zinc -   Aveeno -  IF you received an x-ray today, you will receive [facility] invoice from [facility]. Please contact [facility] at [phone] with questions or concerns regarding your invoice.   IF you received labwork today, you will receive an invoice from [facility]. Please contact [facility] at [phone] with questions or concerns regarding your invoice.   Our billing staff will not be able to assist you with questions regarding bills from these companies.  You will be contacted with the lab results as soon as they are available. The fastest way to get your results is to activate your My Chart account. Instructions are located on the last page of this paperwork. If you have not heard from us regarding the results in 2 weeks, please contact this office.

## 2017-10-16 NOTE — Progress Notes (Signed)
Samantha CooleyKatie M Hernandez  MRN: [id number] DOB: 07/30/1990  PCP: Morrell RiddleWeber, [name], PA-C  Chief Complaint  Patient presents with  . Labs Only    discuss labs and get referral     Subjective:  Pt presents to clinic for general discussion of health and next step.  Patient is currently very frustrated with her medical situation, mainly her skin acne and burning of her skin.  She has seen an allergist due to concerns for allergies.  She has been to the ER on multiple occasions with shortness of breath chest pain and other symptoms caused by her anxiety.  She has seen a dermatologist who sent her for referral to endocrinology though I am not sure for what.  Patient has been seeing me for close to year with chronic anxiety and states during the visit she is ready to do something about it and she knows that her symptoms are coming from anxiety but yet when we talk about things to do she does not want to do that and refers back again in terms of her facial burning and acne and the fact that she is upset that she calls all of the damage to her face using over-the-counter products due to obsessions.  Patient wants to have multiple labs done for multiple chronic medical conditions but not specific for any one or 2 conditions.  During most of the convert sedation she returns back to the fact that she is just so mad at herself for doing what she did a year ago with all the over-the-counter skin therapies.  She really likes her job but is frustrated with her skin making her feel bad at her job.  She is proud of herself for living on her own and financially controlling her life but yet returns to the fact that she ruined her life due to what she did to her face a year ago with over-the-counter products.  [facility] therapist -   History is obtained by patient.  Review of Systems  Psychiatric/Behavioral: Positive for dysphoric mood and sleep disturbance. Negative for self-injury and suicidal ideas. The  patient is nervous/anxious.     Patient Active Problem List   Diagnosis Date Noted  . ADD (attention deficit disorder) 07/19/2011    Current Outpatient Medications on File Prior to Visit  Medication Sig Dispense Refill  . ASTAXANTHIN PO Take 12 mg by mouth daily.    . Azelaic Acid 15 % cream     . b complex vitamins capsule Take 1 capsule by mouth daily.    . cholecalciferol (VITAMIN D) 1000 units tablet Take 1,000 Units by mouth 2 (two) times daily.    . drospirenone-ethinyl estradiol (YAZ,GIANVI,LORYNA) 3-0.02 MG tablet Take 1 tablet by mouth daily. Start on the first day of your cycle after taking the provera. 3 Package 0  . ibuprofen (ADVIL,MOTRIN) 800 MG tablet     . Omega-3 Fatty Acids (FISH OIL) 1200 MG CAPS Take by mouth.    . Polypodium Leucotomos (HELIOCARE PO) Take by mouth.    . Zinc 50 MG CAPS Take by mouth.     No current facility-administered medications on file prior to visit.     No Known Allergies  Past Medical History:  Diagnosis Date  . Acne   . ADD (attention deficit disorder)   . Allergy   . Amenorrhea   . Anxiety   . Hormone disorder   . Insomnia   . Panic attack   . Reactive depression   .  Tinnitus   . 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 Smoking status: Never Smoker  . Smokeless tobacco: Never Used  Substance Use Topics  . Alcohol use: No  . Drug use: No   family history includes Allergic rhinitis in her father and sister; Anxiety disorder in her mother and sister; Bipolar disorder in her cousin and paternal aunt; Depression in her father; Diabetes in her paternal aunt and paternal grandfather.     Objective:  BP 124/62   Pulse (!) 111   Temp 98.2 F (36.8 C) (Oral)   Resp 18   Ht 5' 6.7" (1.694 m)   Wt 196 lb 3.2 oz (89 kg)   LMP  (LMP Unknown)    SpO2 97%   BMI 31.01 kg/m  Body mass index is 31.01 kg/m.  Wt Readings from Last 3 Encounters:  10/16/17 196 lb 3.2 oz (89 kg)  10/10/17 194 lb 3.2 oz (88.1 kg)  09/30/17 199 lb 11.8 oz (90.6 kg)    Physical Exam  Constitutional: She is oriented to person, place, and time. She appears well-developed and well-nourished.  HENT:  Head: Normocephalic and atraumatic.  Right Ear: Hearing and external ear normal.  Left Ear: Hearing and external ear normal.  Eyes: Conjunctivae are normal.  Neck: Normal range of motion.  Cardiovascular: Normal rate, regular rhythm and normal heart sounds.  No murmur heard. Pulmonary/Chest: Effort normal and breath sounds normal. She has no wheezes.  Neurological: She is alert and oriented to person, place, and time.  Skin: Skin is warm and dry.  Psychiatric: Her speech is normal and behavior is normal. Judgment normal. Her mood appears anxious (Mild changes depending on conversation).  Tearful at times, no matter what we talked about constantly returns to her feelings about herself regarding her actions with over the skin care products  Vitals reviewed.  Spent 50 mins with the patient - greater than 50% of the visit was face to face counseling patient mainly listening to her talk and ruminate also though educating her on anxiety and next steps in her medical treatment.  Assessment and Plan :  Generalized anxiety disorder  Insomnia, unspecified type  Skin sensitivity  Acne vulgaris   Patient had an appointment with [facility] did not show up for that appointment has been called and she states that she is can make another appointment but has yet to do so.  She has recently started with a counselor if that is positive, though when reviewing her chart it looks like she was a no-show for this appointment in the [facility]..  The counselor has suggested she seek psychiatric care which are 100% agree with.  I suspect  patient has generalized anxiety disorder resulting in her insomnia and has developed a medical style OCD currently right now obsessed with what she has done with her face with over-the-counter products which is causing her to be over sensitive to any normally occurring symptoms that otherwise would be ignored but she is overfocused on.  This was discussed with patient she seems to understand it but yet continues to return back to blaming herself for her current situation.  I discussed with patient that I would do no lab testing today as she has had a lot of this done  with her recent visits to specialist.  I am concerned that she continues to see specialist who refer her to further specialist I think that she could be worsening or at least adding to her OCD at this point truly believe what she really needs a psychiatric care discussed this with patient and said within the next 3 months that is what she needs to focus on if at that point she still continues to have vague symptoms we can look further.  If she does seem to have one specific symptom we can look into that at her convenience.  She left with an understanding of some general skin care routines even though I am not a dermatologist we suggested a hydrating face wash, daily moisturizer with SPF without tablet.  And good hydrating moisturizer such as coconut oil or hyaluronic acid nightly.  She seemed to be very happy that someone finally told her exactly what to do with her face though I have not no special training I told her my personal regimen.  Patient verbalized to me that they understand the following: diagnosis, what is being done for them, what to expect and what should be done at home.  Their questions have been answered.  See after visit summary for patient specific instructions.  Benny Lennert PA-C  Primary Care at [facility] 10/20/2017 11:13 AM  Please note: Portions of this report may have been transcribed using dragon  voice recognition software. Every effort was made to ensure accuracy; however, inadvertent computerized transcription errors may be present.

## 2017-10-16 NOTE — Telephone Encounter (Signed)
I called and spoke with the patient in regards to her lab results. Afterwards I asked her if she had any questions and she then asked me questions regarding life and seemed depressed and have issues with self-esteem. Dr. Delorse LekPadgett is aware.

## 2017-10-20 ENCOUNTER — Encounter: Payer: Self-pay | Admitting: Physician Assistant

## 2017-10-21 ENCOUNTER — Telehealth: Payer: Self-pay | Admitting: Obstetrics and Gynecology

## 2017-10-21 NOTE — Telephone Encounter (Signed)
Patient left message at lunch wanting a call to schedule an appointment for a procedure and to let nurse know that her period has not come on after taking provera.

## 2017-10-21 NOTE — Telephone Encounter (Signed)
Spoke with patient.   1. Started Provera after [facility]EX 09/22/17, no menses to date. Has not started OCP. Hx of irregular cycles.   2. Request to schedule Colpo. Scheduled for 10/24/17 at 2:45pm with Dr. Oscar LaJertson. Neg Hcg on 09/22/17, patient states she has not been SA since OV. Instricted patient to continue remain abstinent until after colpo.   3. [facility]  "several skin issues since ANA was negative" States she discussed this at [facility] and has been seen by dermatology.   [facility]  Dr. Oscar LaJertson -please review. Ok to proceed with colpo as scheduled? Please advise on referral.

## 2017-10-23 NOTE — Telephone Encounter (Signed)
Colposcopy is fine.  Her FSH and estradiol were low normal, normal prolactin, normal TSH and negative hcg. If she hasn't been sexually active since a month prior to her last visit and hasn't had any bleeding with the provera, she needs a brain MRI for hypothalamic amenorrhea. Please inform and set this up  We can discuss the rheumatology referral at the time of her colposcopy

## 2017-10-23 NOTE — Progress Notes (Deleted)
GYNECOLOGY  VISIT   HPI: 27 y.o.   Single  {Race/ethnicity:17218}  female   [name] with No LMP recorded (lmp unknown). (Menstrual status: Irregular Periods).   here for   Colposcopy due to LSIL pap   GYNECOLOGIC HISTORY: No LMP recorded (lmp unknown). (Menstrual status: Irregular Periods). Contraception: OCP  Menopausal hormone therapy: none         OB History    Gravida  0   Para  0   Term  0   Preterm  0   AB  0   Living  0     SAB  0   TAB  0   Ectopic  0   Multiple  0   Live Births  0              Patient Active Problem List   Diagnosis Date Noted  . ADD (attention deficit disorder) 07/19/2011    Past Medical History:  Diagnosis Date  . Acne   . ADD (attention deficit disorder)   . Allergy   . Amenorrhea   . Anxiety   . Hormone disorder   . Insomnia   . Panic attack   . Reactive depression   . Tinnitus   . Urticaria     No past surgical history on file.  Current Outpatient Medications  Medication Sig Dispense Refill  . ASTAXANTHIN PO Take 12 mg by mouth daily.    . Azelaic Acid 15 % cream     . b complex vitamins capsule Take 1 capsule by mouth daily.    . cholecalciferol (VITAMIN D) 1000 units tablet Take 1,000 Units by mouth 2 (two) times daily.    . drospirenone-ethinyl estradiol (YAZ,GIANVI,LORYNA) 3-0.02 MG tablet Take 1 tablet by mouth daily. Start on the first day of your cycle after taking the provera. 3 Package 0  . ibuprofen (ADVIL,MOTRIN) 800 MG tablet     . Omega-3 Fatty Acids (FISH OIL) 1200 MG CAPS Take by mouth.    . Polypodium Leucotomos (HELIOCARE PO) Take by mouth.    . Zinc 50 MG CAPS Take by mouth.     No current facility-administered medications for this visit.      ALLERGIES: Patient has no known allergies.  Family History  Problem Relation Age of Onset  . Anxiety disorder Mother   . Depression Father   . Allergic rhinitis Father   . Anxiety disorder Sister   . Allergic rhinitis Sister   . Bipolar  disorder Paternal Aunt   . Diabetes Paternal Aunt   . Bipolar disorder Cousin   . Diabetes Paternal Grandfather   . Asthma Neg Hx   . Eczema Neg Hx   . Urticaria Neg Hx     Social History   Socioeconomic History  . Marital status: Single    Spouse name: Not on file  . Number of children: Not on file  . Years of education: Not on file  . Highest education level: Not on file  Occupational History  . Not on file  Social Needs  . Financial resource strain: Not on file  . Food insecurity:    Worry: Not on file    Inability: Not on file  . Transportation needs:    Medical: Not on file    Non-medical: Not on file  Tobacco Use  . Smoking status: Never Smoker  . Smokeless tobacco: Never Used  Substance and Sexual Activity  . Alcohol use: No  . Drug use: No  .  Sexual activity: Not on file  Lifestyle  . Physical activity:    Days per week: Not on file    Minutes per session: Not on file  . Stress: Not on file  Relationships  . Social connections:    Talks on phone: Not on file    Gets together: Not on file    Attends religious service: Not on file    Active member of club or organization: Not on file    Attends meetings of clubs or organizations: Not on file    Relationship status: Not on file  . Intimate partner violence:    Fear of current or ex partner: Not on file    Emotionally abused: Not on file    Physically abused: Not on file    Forced sexual activity: Not on file  Other Topics Concern  . 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    ROS  PHYSICAL EXAMINATION:    LMP  (LMP Unknown)     General appearance: alert, cooperative and appears stated age Neck: no adenopathy, supple, symmetrical, trachea midline and thyroid {CHL AMB PHY EX THYROID NORM DEFAULT:[phone]::"normal to inspection and palpation"} Breasts: {Exam;  breast:13139::"normal appearance, no masses or tenderness"} Abdomen: soft, non-tender; non distended, no masses,  no organomegaly  Pelvic: External genitalia:  no lesions              Urethra:  normal appearing urethra with no masses, tenderness or lesions              Bartholins and Skenes: normal                 Vagina: normal appearing vagina with normal color and discharge, no lesions              Cervix: {CHL AMB PHY EX CERVIX NORM [phone]::"no lesions"}              Bimanual Exam:  Uterus:  {CHL AMB PHY EX UTERUS NORM DEFAULT:[phone]::"normal size, contour, position, consistency, mobility, non-tender"}              Adnexa: {CHL AMB PHY EX ADNEXA NO MASS [phone]::"no mass, fullness, tenderness"}              Rectovaginal: {yes no:314532}.  Confirms.              Anus:  normal sphincter tone, no lesions  Chaperone was present for exam.  ASSESSMENT     PLAN    An After Visit Summary was printed and given to the patient.  *** minutes face to face time of which over 50% was spent in counseling.

## 2017-10-24 ENCOUNTER — Telehealth: Payer: Self-pay | Admitting: Obstetrics and Gynecology

## 2017-10-24 ENCOUNTER — Encounter: Payer: Self-pay | Admitting: Obstetrics and Gynecology

## 2017-10-24 ENCOUNTER — Ambulatory Visit: Payer: Self-pay | Admitting: Obstetrics and Gynecology

## 2017-10-24 NOTE — Telephone Encounter (Signed)
Patient cancelled today's procedure and would like to reschedule.

## 2017-10-24 NOTE — Telephone Encounter (Signed)
Encounter closed.  See open telephone encounter dated 10/21/17.

## 2017-10-24 NOTE — Telephone Encounter (Signed)
Georgiann HahnHobbs, Lindsay N 37 minutes ago (2:18 PM)  Patient cancelled today's procedure and would like to reschedule.  Documentation   Zettie Cooleyate, Terren M [phone]  Bertrum SolHobbs, Lindsay N

## 2017-10-24 NOTE — Telephone Encounter (Signed)
No answer, mailbox full

## 2017-10-29 NOTE — Telephone Encounter (Signed)
Left message advising to return call to [name], [facility] to reschedule procedure and discuss lab results.

## 2017-11-03 NOTE — Telephone Encounter (Signed)
1. Spoke with patient, calling to schedule colpo. Patient has additional questions regarding billing prior to scheduling.   Patient is unsure of exact date of menses, started 2 wks ago. Has not been SA since last OV, hcg negative 09/22/17. Has not started OCP.   2. [facility].   3. Reviewed with Dr. Oscar LaJertson, ok to proceed with colpo, continue to abstain from intercourse.   4. Spoke with patient, colpo scheduled for 11/13/17 at 9am with Dr. Oscar LaJertson. Patient declined earlier appt. Advised to continue to abstain from intercourse, return call to office if any chance of pregnancy or with any heavy bleeding.  5. Patient asking if ok to start OCP now? Advised will review with Dr. Oscar LaJertson and return call.  Dr. Oscar LaJertson -please advise on OCP start?  Cc: Harland DingwallSuzy Dixon

## 2017-11-03 NOTE — Telephone Encounter (Signed)
Patient is ready to reschedule her procedure. 

## 2017-11-04 NOTE — Telephone Encounter (Signed)
As long as the patient took the provera and had a cycle she can just start the pill today.

## 2017-11-04 NOTE — Telephone Encounter (Signed)
MyChart active, message to patient advising as seen below per Dr. Oscar LaJertson.   Encounter closed.

## 2017-11-04 NOTE — Telephone Encounter (Signed)
Mailbox full, unable to leave message

## 2017-11-06 ENCOUNTER — Ambulatory Visit: Payer: Self-pay | Admitting: Obstetrics and Gynecology

## 2017-11-06 NOTE — Telephone Encounter (Signed)
No answer, mailbox full, unable to leave message.

## 2017-11-11 NOTE — Progress Notes (Deleted)
poGYNECOLOGY  VISIT   HPI: 27 y.o.   Single  Caucasian  female   [name] with No LMP recorded. (Menstrual status: Irregular Periods).   here for colposcopy.  Last pap 09/22/2017, LSIL.  GYNECOLOGIC HISTORY: No LMP recorded. (Menstrual status: Irregular Periods). Contraception:*** Menopausal hormone therapy: n/a        OB History    Gravida  0   Para  0   Term  0   Preterm  0   AB  0   Living  0     SAB  0   TAB  0   Ectopic  0   Multiple  0   Live Births  0              Patient Active Problem List   Diagnosis Date Noted  . ADD (attention deficit disorder) 07/19/2011    Past Medical History:  Diagnosis Date  . Acne   . ADD (attention deficit disorder)   . Allergy   . Amenorrhea   . Anxiety   . Hormone disorder   . Insomnia   . Panic attack   . Reactive depression   . Tinnitus   . Urticaria     No past surgical history on file.  Current Outpatient Medications  Medication Sig Dispense Refill  . ASTAXANTHIN PO Take 12 mg by mouth daily.    . Azelaic Acid 15 % cream     . b complex vitamins capsule Take 1 capsule by mouth daily.    . cholecalciferol (VITAMIN D) 1000 units tablet Take 1,000 Units by mouth 2 (two) times daily.    . drospirenone-ethinyl estradiol (YAZ,GIANVI,LORYNA) 3-0.02 MG tablet Take 1 tablet by mouth daily. Start on the first day of your cycle after taking the provera. 3 Package 0  . ibuprofen (ADVIL,MOTRIN) 800 MG tablet     . Omega-3 Fatty Acids (FISH OIL) 1200 MG CAPS Take by mouth.    . Polypodium Leucotomos (HELIOCARE PO) Take by mouth.    . Zinc 50 MG CAPS Take by mouth.     No current facility-administered medications for this visit.      ALLERGIES: Patient has no known allergies.  Family History  Problem Relation Age of Onset  . Anxiety disorder Mother   . Depression Father   . Allergic rhinitis Father   . Anxiety disorder Sister   . Allergic rhinitis Sister   . Bipolar disorder Paternal Aunt   . Diabetes  Paternal Aunt   . Bipolar disorder Cousin   . Diabetes Paternal Grandfather   . Asthma Neg Hx   . Eczema Neg Hx   . Urticaria Neg Hx     Social History   Socioeconomic History  . Marital status: Single    Spouse name: Not on file  . Number of children: Not on file  . Years of education: Not on file  . Highest education level: Not on file  Occupational History  . Not on file  Social Needs  . Financial resource strain: Not on file  . Food insecurity:    Worry: Not on file    Inability: Not on file  . Transportation needs:    Medical: Not on file    Non-medical: Not on file  Tobacco Use  . Smoking status: Never Smoker  . Smokeless tobacco: Never Used  Substance and Sexual Activity  . Alcohol use: No  . Drug use: No  . Sexual activity: Not on file  Lifestyle  .  Physical activity:    Days per week: Not on file    Minutes per session: Not on file  . Stress: Not on file  Relationships  . Social connections:    Talks on phone: Not on file    Gets together: Not on file    Attends religious service: Not on file    Active member of club or organization: Not on file    Attends meetings of clubs or organizations: Not on file    Relationship status: Not on file  . Intimate partner violence:    Fear of current or ex partner: Not on file    Emotionally abused: Not on file    Physically abused: Not on file    Forced sexual activity: Not on file  Other Topics Concern  . 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   HENT: Negative.   Eyes: Negative.   Respiratory: Negative.   Cardiovascular: Negative.   Gastrointestinal: Negative.   Genitourinary: Negative.   Musculoskeletal: Negative.   Skin: Negative.   Neurological: Negative.   Endo/Heme/Allergies: Negative.   Psychiatric/Behavioral: Negative.    All other systems reviewed and are negative.   PHYSICAL EXAMINATION:    There were no vitals taken for this visit.    General appearance: alert, cooperative and appears stated age Neck: no adenopathy, supple, symmetrical, trachea midline and thyroid {CHL AMB PHY EX THYROID NORM [phone]::"normal to inspection and palpation"} Breasts: {Exam; breast:13139::"normal appearance, no masses or tenderness"} Abdomen: soft, non-tender; non distended, no masses,  no organomegaly  Pelvic: External genitalia:  no lesions              Urethra:  normal appearing urethra with no masses, tenderness or lesions              Bartholins and Skenes: normal                 Vagina: normal appearing vagina with normal color and discharge, no lesions              Cervix: {CHL AMB PHY EX CERVIX NORM DEFAULT:[id number]::"no lesions"}              Bimanual Exam:  Uterus:  {CHL AMB PHY EX UTERUS NORM DEFAULT:[phone]::"normal size, contour, position, consistency, mobility, non-tender"}              Adnexa: {CHL AMB PHY EX ADNEXA NO MASS DEFAULT:[id number]::"no mass, fullness, tenderness"}              Rectovaginal: {yes no:314532}.  Confirms.              Anus:  normal sphincter tone, no lesions  Chaperone was present for exam.  ASSESSMENT     PLAN    An After Visit Summary was printed and given to the patient.  *** minutes face to face time of which over 50% was spent in counseling.

## 2017-11-13 ENCOUNTER — Ambulatory Visit: Payer: Self-pay | Admitting: Obstetrics and Gynecology

## 2017-11-13 ENCOUNTER — Telehealth: Payer: Self-pay | Admitting: Obstetrics and Gynecology

## 2017-11-13 ENCOUNTER — Encounter: Payer: Self-pay | Admitting: Obstetrics and Gynecology

## 2017-11-13 NOTE — Telephone Encounter (Signed)
Routing to S. Yeakley, RN.  

## 2017-11-13 NOTE — Telephone Encounter (Signed)
Patient dnka her 9:00am  colpo appointment today with Dr.Jertson. I spoke with patient st 9:33am and she stated "I'm sorry I had to work late last night". To triage to assist with rescheduling. [facility].

## 2017-11-19 ENCOUNTER — Encounter: Payer: Self-pay | Admitting: *Deleted

## 2017-11-19 NOTE — Telephone Encounter (Signed)
Letter written and signed Dr Oscar LaJertson.   Routing to provider for final review. Encounter closed.

## 2017-11-26 ENCOUNTER — Encounter: Payer: Self-pay | Admitting: Physician Assistant

## 2017-11-26 ENCOUNTER — Encounter: Payer: Self-pay | Admitting: Obstetrics and Gynecology

## 2017-11-27 ENCOUNTER — Telehealth: Payer: Self-pay | Admitting: Obstetrics and Gynecology
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
## 2017-12-16 ENCOUNTER — Encounter: Payer: Self-pay | Admitting: Physician Assistant

## 2017-12-24 ENCOUNTER — Encounter: Payer: Self-pay | Admitting: Physician Assistant

## 2018-02-10 ENCOUNTER — Ambulatory Visit: Payer: Managed Care, Other (non HMO) | Admitting: Physician Assistant

## 2018-02-25 ENCOUNTER — Ambulatory Visit: Payer: Managed Care, Other (non HMO) | Admitting: Family Medicine

## 2018-07-02 NOTE — Telephone Encounter (Signed)
Dr. Delorse Lek, I just wanted to make sure that you are on board with this patient coming in for environmental allergy testing?

## 2018-07-15 ENCOUNTER — Ambulatory Visit: Payer: Self-pay | Admitting: Allergy

## 2018-08-28 ENCOUNTER — Other Ambulatory Visit: Payer: Self-pay

## 2018-08-31 ENCOUNTER — Encounter: Payer: Self-pay | Admitting: Gynecology

## 2018-08-31 ENCOUNTER — Ambulatory Visit (INDEPENDENT_AMBULATORY_CARE_PROVIDER_SITE_OTHER): Payer: Managed Care, Other (non HMO) | Admitting: Gynecology

## 2018-08-31 ENCOUNTER — Other Ambulatory Visit: Payer: Self-pay

## 2018-08-31 VITALS — BP 118/78 | Ht 68.0 in | Wt 223.0 lb

## 2018-08-31 DIAGNOSIS — Z8742 Personal history of other diseases of the female genital tract: Secondary | ICD-10-CM

## 2018-08-31 DIAGNOSIS — R102 Pelvic and perineal pain: Secondary | ICD-10-CM

## 2018-08-31 DIAGNOSIS — N72 Inflammatory disease of cervix uteri: Secondary | ICD-10-CM | POA: Diagnosis not present

## 2018-08-31 DIAGNOSIS — N926 Irregular menstruation, unspecified: Secondary | ICD-10-CM | POA: Diagnosis not present

## 2018-08-31 DIAGNOSIS — Z113 Encounter for screening for infections with a predominantly sexual mode of transmission: Secondary | ICD-10-CM

## 2018-08-31 DIAGNOSIS — R87612 Low grade squamous intraepithelial lesion on cytologic smear of cervix (LGSIL): Secondary | ICD-10-CM

## 2018-08-31 DIAGNOSIS — Z124 Encounter for screening for malignant neoplasm of cervix: Secondary | ICD-10-CM

## 2018-08-31 LAB — WET PREP FOR TRICH, YEAST, CLUE

## 2018-08-31 MED ORDER — NORETHIN ACE-ETH ESTRAD-FE 1-20 MG-MCG PO TABS: 1.0000 | ORAL_TABLET | Freq: Every day | ORAL | 11 refills | Status: AC

## 2018-08-31 NOTE — Patient Instructions (Signed)
Started on the low-dose birth control pills as we discussed.  Call if you have any issues once starting them.  Office will call you with the Pap smear and biopsy results.

## 2018-08-31 NOTE — Addendum Note (Signed)
Addended by: Dayna Barker on: 08/31/2018 11:47 AM   Modules accepted: Orders

## 2018-08-31 NOTE — Progress Notes (Signed)
Samantha Hernandez May 16, 1990 [id number]        28 y.o.  [name] new new patient presents with several issues:  1. First Pap smear last year 08/2017 elsewhere showed LGSIL.  Does not believe that she had any Pap smears preceding this.  Was advised to proceed with colposcopy but never followed up for this. 2. Irregular menses.  LMP 05/02/2018 over the last several years her menses have been irregular where she will have a period every other month and now will go up to 3 months.  Her LMP is 05/02/2018.  She notes some chin hair growth.  No significant changes in weight, hair or skin.  Had PCOS blood work done last year to include DHEAS, 17 hydroxyprogesterone, FSH, prolactin, TSH, testosterone and hCG which were all normal.  Currently sexually active using condoms. 3. Episode of pelvic/rectal pain with anal intercourse.  First time that she has had the pain.  Resolved afterwards. 4. Intermittent vaginal discharge.  No odor irritation or itching.  No urinary symptoms such as frequency dysuria urgency.   Past medical history,surgical history, problem list, medications, allergies, family history and social history were all reviewed and documented as reviewed in the EPIC chart.  ROS:  Performed with pertinent positives and negatives included in the history, assessment and plan.   Additional significant findings : None   Exam: Ulysees Barns assistant Vitals:   08/31/18 1004  BP: 118/78  Weight: 223 lb (101.2 kg)  Height: 5\' 8"  (1.727 m)   Body mass index is 33.91 kg/m.  General appearance:  Normal affect, orientation and appearance. Skin: Grossly normal HEENT: Without gross lesions.  No cervical or supraclavicular adenopathy. Thyroid normal.  Lungs:  Clear without wheezing, rales or rhonchi Cardiac: RR, without RMG Abdominal:  Soft, nontender,  normalwithout masses, guarding, rebound, organomegaly or hernia Breasts:  Examined lying and sitting without masses, retractions, discharge or  axillary adenopathy. Pelvic:  Ext, BUS, Vagina: Normal.  Wet prep done  Cervix: Grossly normal.  GC/chlamydia screen done.  Pap smear done  Uterus: Anteverted, normal size, shape and contour, midline and mobile nontender   Adnexa: Without masses or tenderness    Anus and perineum: Normal   Rectovaginal: Normal sphincter tone without palpated masses or tenderness.  No evidence of anal fissure, hemorrhoids or other abnormalities  Colposcopy performed after acetic acid cleanse is adequate noting ectropion with transformation zone clearly visualized 360 degrees.  With area of acetowhite change 12-3 o'clock transformation zone.  Questionable metaplasia versus dysplasia.  Representative biopsy taken.  Patient tolerated well.  Physical Exam  Genitourinary:       Assessment/Plan:  29 y.o. [name] female with the following issues:  1. 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  I repeated her Pap smear first and then colposcopy showed an area of acetowhite change as described above versus metaplasia.  Representative biopsy taken.  The patient and I discussed dysplasia in detail, high-grade/low-grade, progression/regression and the HPV association.  If Pap smear/biopsy return low-grade and plan expectant management with follow-up Pap smear next year.  If otherwise then will triage based upon results. 2. Irregular menses.  Discussed irregular ovulation as well as PCOS possibilities.  Lab work showed a normal testosterone in the 40 range last year and negative DHEAS and 17 hydroxyprogesterone as well as FSH TSH and prolactin.  Patient not interested in pregnancy.  I discussed the benefits of low-dose oral contraceptive from a menstrual regulation standpoint as well as from a contraceptive  standpoint.  She notes some chin hair growth but no evidence of true hirsutism or masculinization.  Discussed that this may also help with her hair growth also.  The patient wants to go ahead and start and I prescribed Loestrin 1/20  equivalent x1 year.  Check qualitative hCG today and then go ahead and start the pills.  She will call if she has any issues after starting. 3. Intermittent vaginal discharge.  Exam today is normal.  Wet prep was negative.  GC/chlamydia screen done.  Patient will represent if she develops an active discharge. 4. Pelvic/rectal pain with anal intercourse x1.  Exam is normal.  Patient is going to monitor for now.  If becomes recurrent issue she will call and we will start with ultrasound of the pelvis.   Dara Lordsimothy P Fontaine MD, 11:21 AM 08/31/2018

## 2018-09-01 LAB — HCG, SERUM, QUALITATIVE: Preg, Serum: NEGATIVE

## 2018-09-02 LAB — PAP IG W/ RFLX HPV ASCU

## 2018-09-02 LAB — PATHOLOGY REPORT

## 2018-09-02 LAB — TISSUE SPECIMEN

## 2018-09-03 LAB — C. TRACHOMATIS/N. GONORRHOEAE RNA
C. trachomatis RNA, TMA: NOT DETECTED
N. gonorrhoeae RNA, TMA: NOT DETECTED

## 2018-09-07 ENCOUNTER — Encounter: Payer: Self-pay | Admitting: Gynecology

## 2018-09-08 ENCOUNTER — Ambulatory Visit: Payer: Managed Care, Other (non HMO) | Admitting: Gynecology

## 2018-09-16 ENCOUNTER — Encounter: Payer: Self-pay | Admitting: Gynecology

## 2018-09-28 ENCOUNTER — Telehealth: Payer: Self-pay | Admitting: Plastic Surgery

## 2018-09-28 NOTE — Telephone Encounter (Signed)

## 2018-09-29 ENCOUNTER — Encounter: Payer: Self-pay | Admitting: Plastic Surgery

## 2018-09-29 ENCOUNTER — Other Ambulatory Visit: Payer: Self-pay

## 2018-09-29 ENCOUNTER — Ambulatory Visit (INDEPENDENT_AMBULATORY_CARE_PROVIDER_SITE_OTHER): Payer: Managed Care, Other (non HMO) | Admitting: Plastic Surgery

## 2018-09-29 DIAGNOSIS — L709 Acne, unspecified: Secondary | ICD-10-CM | POA: Insufficient documentation

## 2018-09-29 NOTE — Progress Notes (Signed)
Patient ID: Samantha Hernandez, female    DOB: 1991-03-08, 28 y.o.   MRN: [id number]   Chief Complaint  Patient presents with  . Advice Only    The patient is a 28 year old female here for evaluation of her face.  The patient states that she used Differin gel a few times several months ago.  She thinks that there has been significant change in her face since that time.  She describes dryness, redness, loss of volume, worsening acne, and a change in her skin texture.  She also states that she has been to 5 different physicians and dermatologist trying to get help.  She has tried doxycycline and is currently on minocycline.  She does not think that it has helped.  She states that her face is tender to touch.  She describes an inability to go out in the sun without it burning or hurting her.  She occasionally uses hydrocortisone to help with the problems.  She states that her life has been dramatically altered because of this Differin gel she further describes her relationship with her family as being estranged with essentially no support network at all.  She states that her mother is a Energy manager and that she is seeing a therapist.  On exam she has a little bit of rosacea of the cheeks and a little bit of acne around the chin.  This may be related to having to wear the mask.  It sounds like the minocycline is a very good option.  I would not recommend any intervention from a cosmetic standpoint at this time.  With all that she feels is going on I think it would be difficult to tweeze out adding any additional factors.  She asked about cheek implants and I think that that would not be a very good idea and certainly not needed right now.  This is in contrast to her thinking she has volume loss.  She has had significant weight loss and this may have changed her facial appearance.  She acknowledged this was a possibility   Review of Systems  Constitutional: Positive for activity change. Negative for appetite  change.  HENT: Negative for dental problem.   Eyes: Positive for photophobia.  Respiratory: Negative for chest tightness and shortness of breath.   Cardiovascular: Negative.  Negative for leg swelling.  Gastrointestinal: Negative for abdominal pain.  Genitourinary: Negative.   Musculoskeletal: Negative.   Skin: Negative.   Psychiatric/Behavioral: Positive for behavioral problems.    Past Medical History:  Diagnosis Date  . Acne   . ADD (attention deficit disorder)   . Allergy   . Amenorrhea   . Anxiety   . Hormone disorder   . Insomnia   . Panic attack   . Reactive depression   . Tinnitus   . Urticaria     History reviewed. No pertinent surgical history.    Current Outpatient Medications:  .  amphetamine-dextroamphetamine (ADDERALL) 20 MG tablet, Take 20 mg by mouth daily., Disp: , Rfl:  .  ASTAXANTHIN PO, Take 12 mg by mouth daily., Disp: , Rfl:  .  Azelaic Acid 15 % cream, , Disp: , Rfl:  .  b complex vitamins capsule, Take 1 capsule by mouth daily., Disp: , Rfl:  .  cholecalciferol (VITAMIN D) 1000 units tablet, Take 1,000 Units by mouth 2 (two) times daily., Disp: , Rfl:  .  drospirenone-ethinyl estradiol (YAZ,GIANVI,LORYNA) 3-0.02 MG tablet, Take 1 tablet by mouth daily. Start on the first day  of your cycle after taking the provera. (Patient not taking: Reported on 08/31/2018), Disp: 3 Package, Rfl: 0 .  ibuprofen (ADVIL,MOTRIN) 800 MG tablet, , Disp: , Rfl:  .  norethindrone-ethinyl estradiol (LOESTRIN FE) 1-20 MG-MCG tablet, Take 1 tablet by mouth daily., Disp: 1 Package, Rfl: 11 .  Omega-3 Fatty Acids (FISH OIL) 1200 MG CAPS, Take by mouth., Disp: , Rfl:  .  Polypodium Leucotomos (HELIOCARE PO), Take by mouth., Disp: , Rfl:  .  Zinc 50 MG CAPS, Take by mouth., Disp: , Rfl:    Objective:   Vitals:   09/29/18 1020  BP: 116/76  Pulse: 87  Temp: 98.2 F (36.8 C)  SpO2: 98%    Physical Exam Vitals signs and nursing note reviewed.  Constitutional:       Appearance: Normal appearance.  HENT:     Head: Normocephalic and atraumatic.  Cardiovascular:     Rate and Rhythm: Normal rate.  Pulmonary:     Effort: Pulmonary effort is normal.  Neurological:     General: No focal deficit present.     Mental Status: She is alert.  Psychiatric:        Mood and Affect: Mood normal.     Assessment & Plan:  Acne, unspecified acne type I have recommended that she continue to see the therapist and perhaps a psychologist to help her through this difficult time.  I shared with her my recommendation for not doing anything additional on her face from a injection or surgical standpoint.  I recommend a very mild facial cleanser and hat for sunblock.  I did give her the name for oil of the leg and of the nail.  She asked if she could come back.  Certainly happy to see her again I am not sure what more I can add. I think continuing the minocycline for the acne would be helpful as well as possibly oral contraception if it is hormone related.  She does have an appointment set up to be evaluated for that with her GYN.   Alena Billslaire S , DO

## 2018-09-30 ENCOUNTER — Ambulatory Visit: Payer: Managed Care, Other (non HMO) | Admitting: Obstetrics and Gynecology

## 2018-10-12 ENCOUNTER — Encounter: Payer: Self-pay | Admitting: Plastic Surgery

## 2018-10-13 ENCOUNTER — Telehealth: Payer: Self-pay

## 2018-10-13 NOTE — Telephone Encounter (Signed)
Call to pt- no answer- left v/m requesting call back 

## 2018-11-09 ENCOUNTER — Encounter: Payer: Self-pay | Admitting: Plastic Surgery

## 2018-11-28 ENCOUNTER — Encounter: Payer: Self-pay | Admitting: Gynecology

## 2018-11-30 NOTE — Telephone Encounter (Signed)
Cervicitis which means inflammation of the cervix is a very common read on biopsies.  The cervix is where the inside world meets the outside world and frequently we will see some inflammation as a "normal".  I normally do not treat this if the patient is not having other issues.  We did the major evaluation with the colposcopy and biopsies that did not show significant atypical changes.  We also did cultures that did not show but as of STDs.  I do not think anything further at this point needs to be done other than repeating the Pap smear at a 1 year interval.  Certainly if discharge or other signs of a vaginal infection we can treat it but in the absence of these symptoms I would do nothing.

## 2018-12-04 ENCOUNTER — Encounter: Payer: Self-pay | Admitting: Plastic Surgery

## 2018-12-21 ENCOUNTER — Encounter: Payer: Self-pay | Admitting: Gynecology

## 2018-12-31 NOTE — Telephone Encounter (Addendum)
Called and spoke with the patient and informed her that I was following up with her regarding her last MyChart message she sent.  I told her that we received a message that she had not read her MyChart message from [place].  Patient stated that she had not read it.  I informed her that Frances Furbish has tried to contact her, and have her contact the our office regarding her appointment on (01/05/19).  She said she believes her MyChart is messed up.  Informed her that Dr. Marla Roe would like to do a televist with her on (01/05/19).  She feels she does not have anything to offer her,so she does not need to see her in the office.  Asked the patient if she would like to do the televisit, and she stated she would.  Informed her that at her appointment time on (01/05/19) she will get a call from Dr. Marla Roe.  Patient verbalized understanding and agreed.//AB/CMA

## 2019-01-05 ENCOUNTER — Telehealth: Payer: Self-pay

## 2019-01-05 ENCOUNTER — Ambulatory Visit: Payer: Managed Care, Other (non HMO) | Admitting: Plastic Surgery

## 2019-01-05 NOTE — Telephone Encounter (Signed)
Patient scheduled for televisit today. Attempted phone number on file twice, but the voicemail box is full and we are unable to leave a message. Patient marked as no-show.

## 2019-01-26 ENCOUNTER — Encounter: Payer: Self-pay | Admitting: Plastic Surgery

## 2019-01-31 ENCOUNTER — Encounter: Payer: Self-pay | Admitting: Gynecology

## 2019-02-01 ENCOUNTER — Other Ambulatory Visit: Payer: Self-pay

## 2019-02-01 DIAGNOSIS — N912 Amenorrhea, unspecified: Secondary | ICD-10-CM

## 2019-02-01 NOTE — Telephone Encounter (Signed)
She can come by for serum qualitative hCG

## 2019-08-27 IMAGING — DX DG CHEST 2V
2 series · 2 of 2 positions shown · non-contrast
Comparison: None.

CLINICAL DATA: Shortness of breath and heart racing after a cardio
workout at 7 p.m.. Patient wakes in the night because she can't
breathe for several months.

EXAM:
CHEST - 2 VIEW

[chest pa]
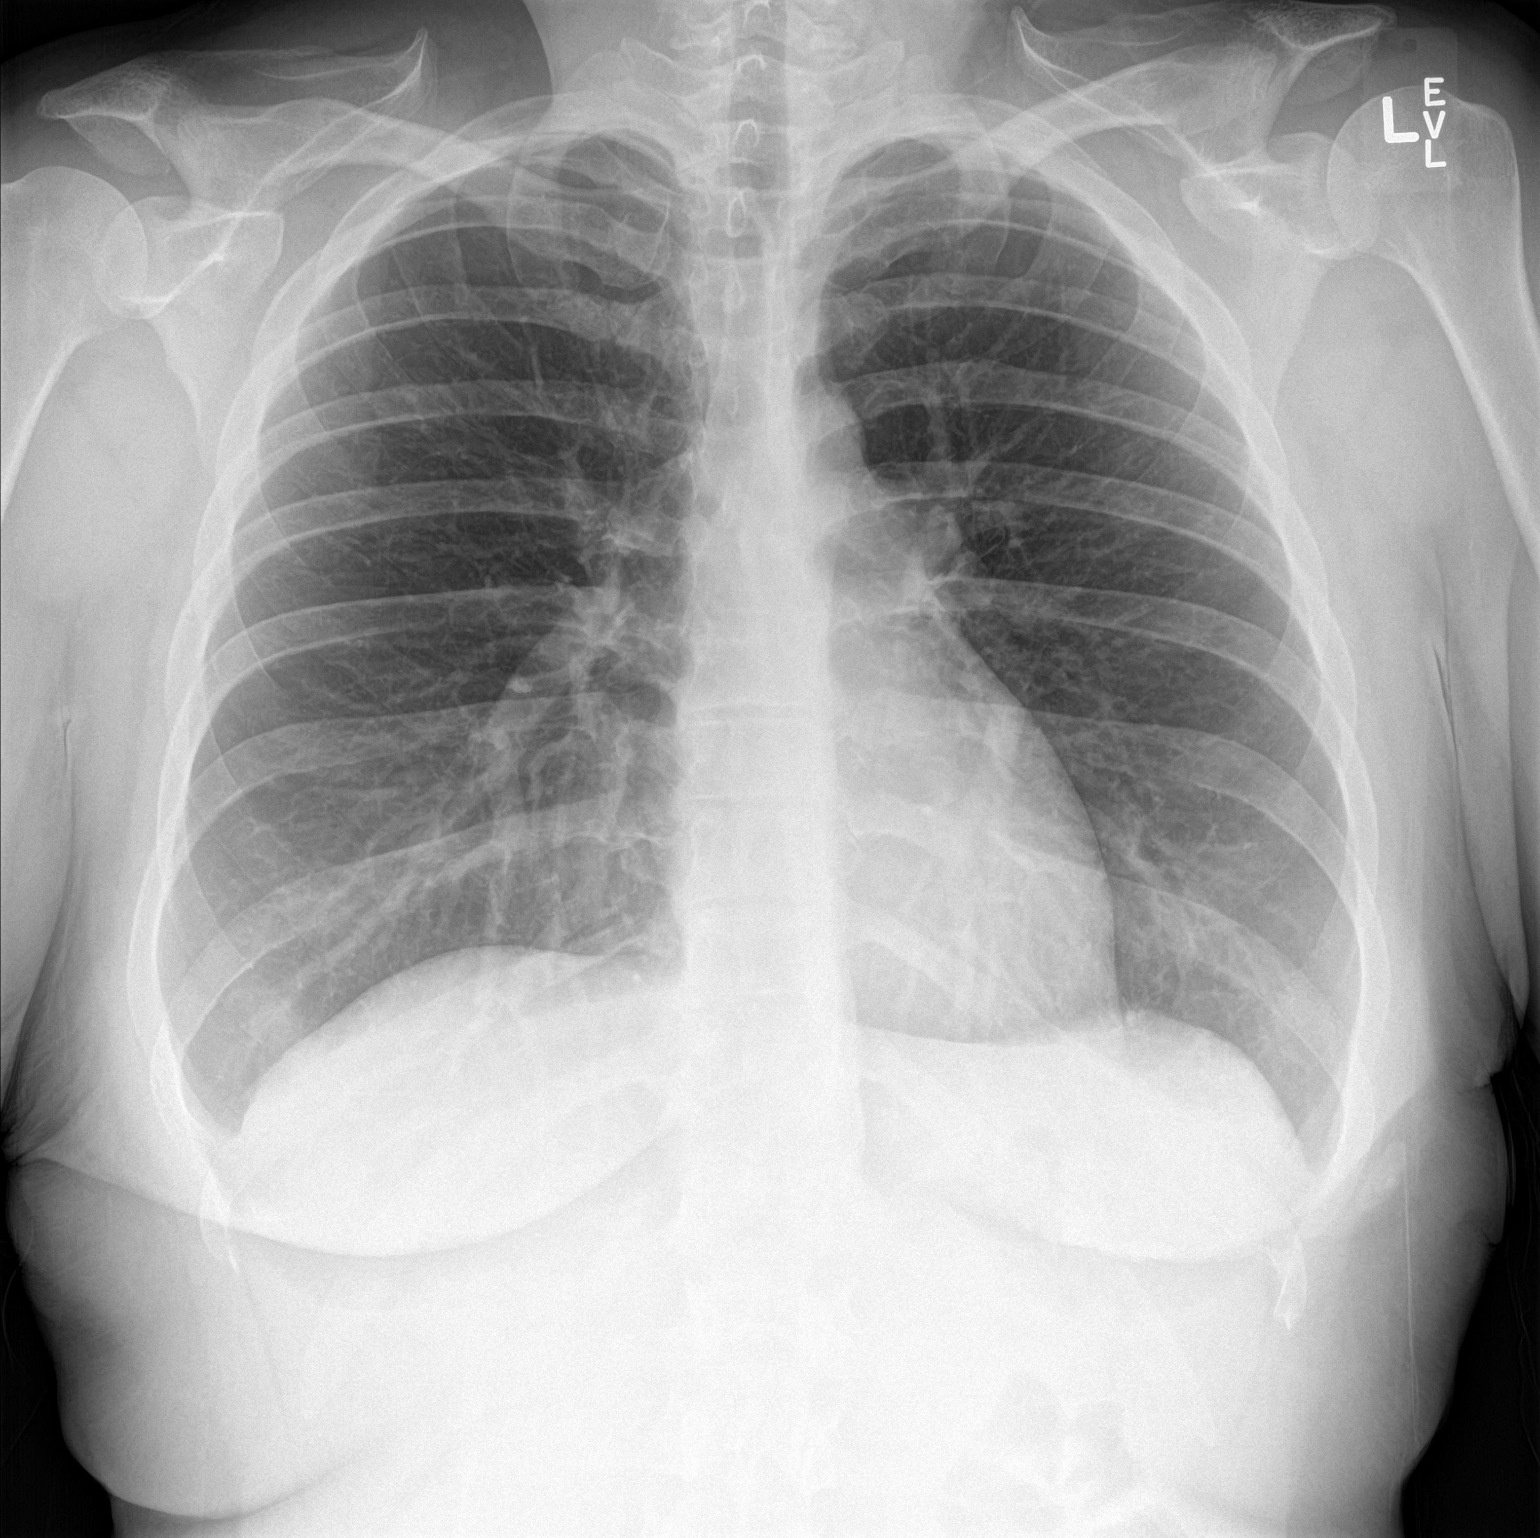

[chest lat]
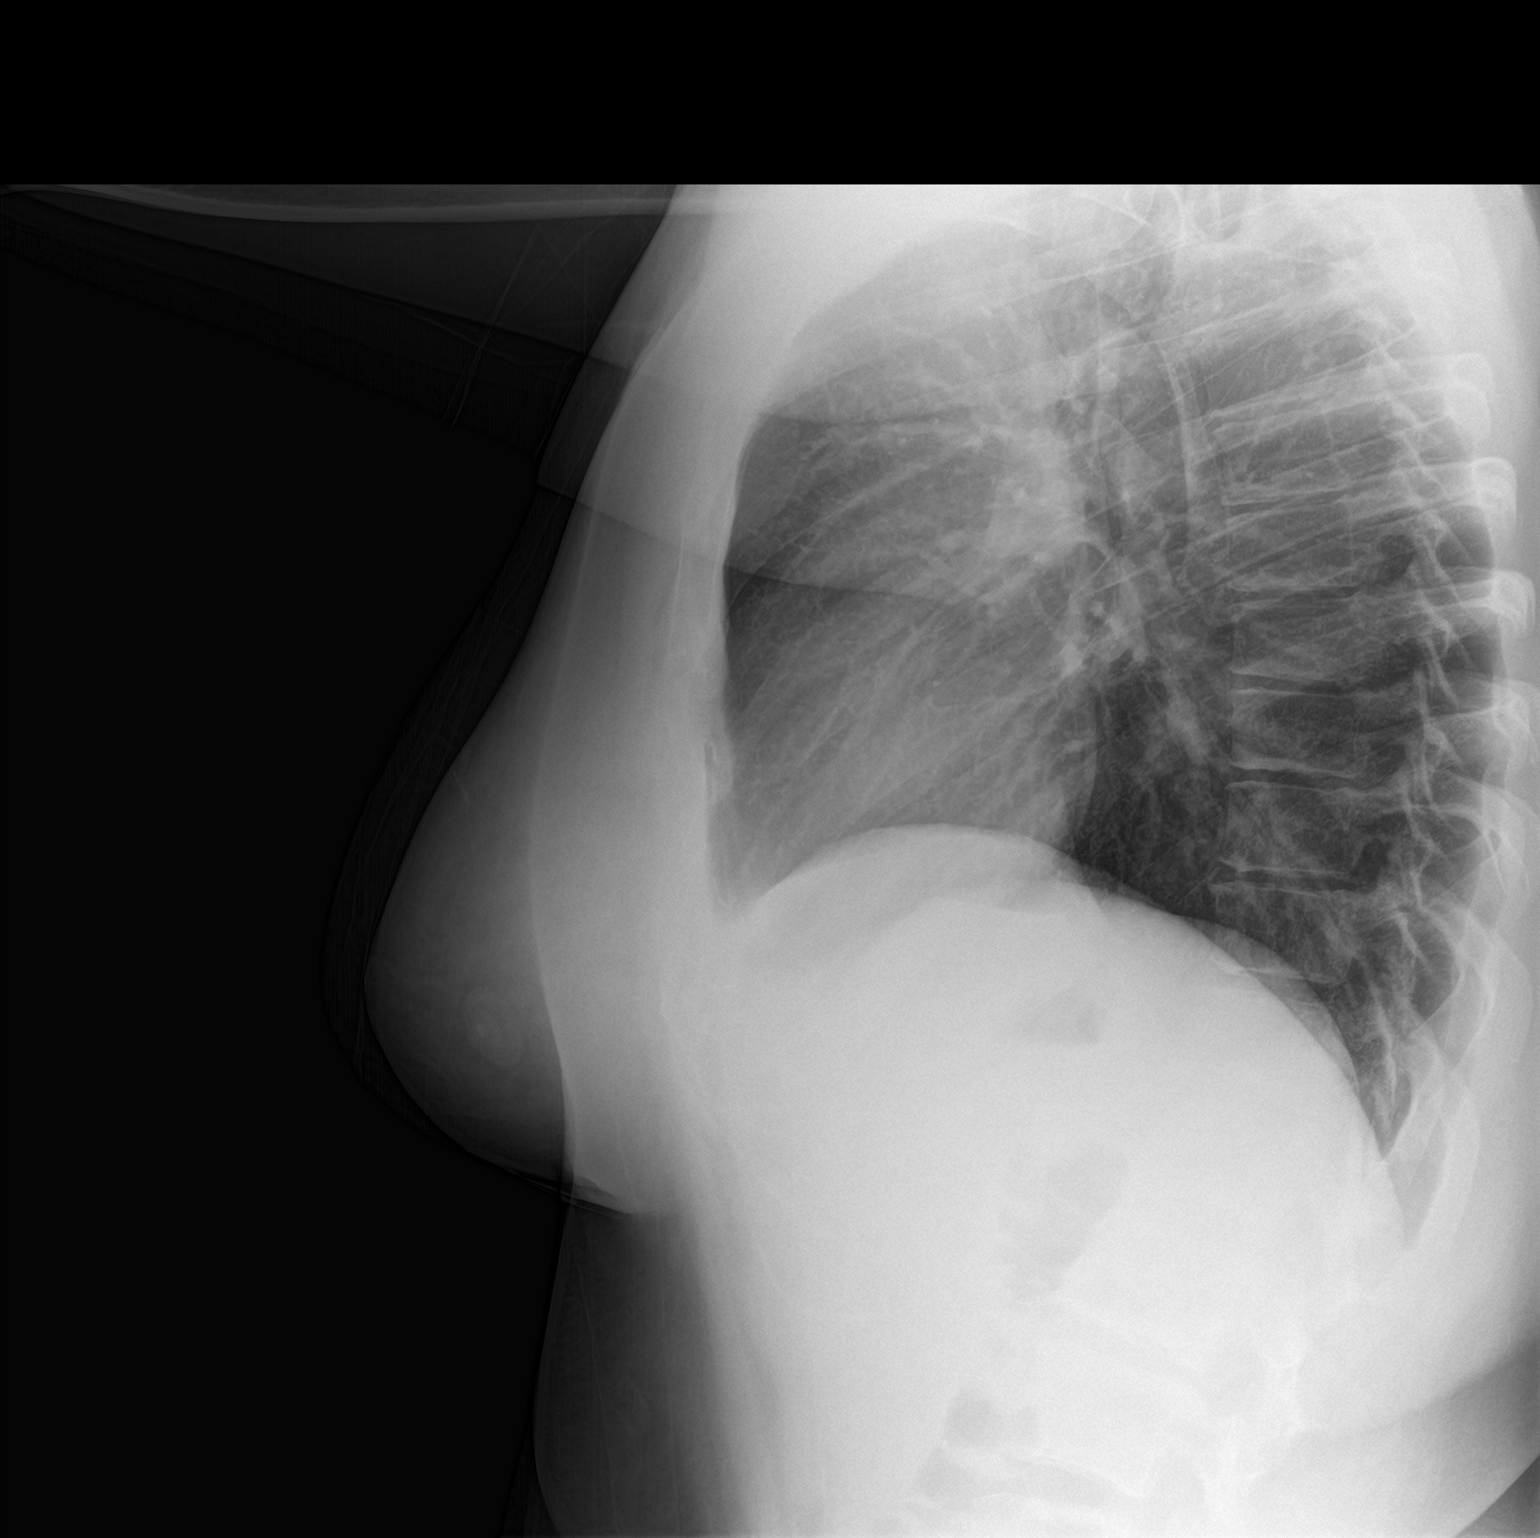

[2 of 2 positions shown; findings below may reference images not displayed]

FINDINGS: The heart size and mediastinal contours are within normal limits.
Both lungs are clear. The visualized skeletal structures are
unremarkable.
IMPRESSION: No active cardiopulmonary disease.

## 2019-11-28 ENCOUNTER — Emergency Department (HOSPITAL_BASED_OUTPATIENT_CLINIC_OR_DEPARTMENT_OTHER): Payer: Self-pay

## 2019-11-28 ENCOUNTER — Emergency Department (HOSPITAL_BASED_OUTPATIENT_CLINIC_OR_DEPARTMENT_OTHER)
Admission: EM | Admit: 2019-11-28 | Discharge: 2019-11-28 | Disposition: A | Discharge status: Home / Self Care | Payer: Self-pay | Attending: Emergency Medicine | Admitting: Emergency Medicine

## 2019-11-28 ENCOUNTER — Encounter (HOSPITAL_BASED_OUTPATIENT_CLINIC_OR_DEPARTMENT_OTHER): Payer: Self-pay | Admitting: *Deleted

## 2019-11-28 ENCOUNTER — Other Ambulatory Visit: Payer: Self-pay

## 2019-11-28 DIAGNOSIS — K029 Dental caries, unspecified: Secondary | ICD-10-CM

## 2019-11-28 DIAGNOSIS — R0602 Shortness of breath: Secondary | ICD-10-CM | POA: Insufficient documentation

## 2019-11-28 DIAGNOSIS — Z79899 Other long term (current) drug therapy: Secondary | ICD-10-CM | POA: Insufficient documentation

## 2019-11-28 DIAGNOSIS — Z87891 Personal history of nicotine dependence: Secondary | ICD-10-CM | POA: Insufficient documentation

## 2019-11-28 DIAGNOSIS — K089 Disorder of teeth and supporting structures, unspecified: Secondary | ICD-10-CM | POA: Insufficient documentation

## 2019-11-28 LAB — CBC WITH DIFFERENTIAL/PLATELET
Abs Immature Granulocytes: 0.03 10*3/uL (ref 0.00–0.07)
Basophils Absolute: 0.1 10*3/uL (ref 0.0–0.1)
Basophils Relative: 1 %
Eosinophils Absolute: 0.3 10*3/uL (ref 0.0–0.5)
Eosinophils Relative: 3 %
HCT: 42.6 % (ref 36.0–46.0)
Hemoglobin: 14.3 g/dL (ref 12.0–15.0)
Immature Granulocytes: 0 %
Lymphocytes Relative: 19 %
Lymphs Abs: 1.6 10*3/uL (ref 0.7–4.0)
MCH: 30.4 pg (ref 26.0–34.0)
MCHC: 33.6 g/dL (ref 30.0–36.0)
MCV: 90.4 fL (ref 80.0–100.0)
Monocytes Absolute: 0.8 10*3/uL (ref 0.1–1.0)
Monocytes Relative: 10 %
Neutro Abs: 5.7 10*3/uL (ref 1.7–7.7)
Neutrophils Relative %: 67 %
Platelets: 286 10*3/uL (ref 150–400)
RBC: 4.71 MIL/uL (ref 3.87–5.11)
RDW: 12.6 % (ref 11.5–15.5)
WBC: 8.5 10*3/uL (ref 4.0–10.5)
nRBC: 0 % (ref 0.0–0.2)

## 2019-11-28 LAB — BASIC METABOLIC PANEL
Anion gap: 9 (ref 5–15)
BUN: 15 mg/dL (ref 6–20)
CO2: 23 mmol/L (ref 22–32)
Calcium: 8.9 mg/dL (ref 8.9–10.3)
Chloride: 103 mmol/L (ref 98–111)
Creatinine, Ser: 0.7 mg/dL (ref 0.44–1.00)
GFR calc Af Amer: >60 mL/min (ref >60)
GFR calc non Af Amer: >60 mL/min (ref >60)
Glucose, Bld: 107 mg/dL — ABNORMAL HIGH (ref 70–99)
Potassium: 3.8 mmol/L (ref 3.5–5.1)
Sodium: 135 mmol/L (ref 135–145)

## 2019-11-28 LAB — PREGNANCY, URINE: Preg Test, Ur: NEGATIVE

## 2019-11-28 MED ORDER — NAPROXEN 375 MG PO TABS: 375.0000 mg | ORAL_TABLET | Freq: Two times a day (BID) | ORAL | 0 refills | Status: AC

## 2019-11-28 MED ORDER — IBUPROFEN 800 MG PO TABS
800.0000 mg | ORAL_TABLET | Freq: Once | ORAL | Status: AC
  Administered 2019-11-28: 800 mg via ORAL
  Filled 2019-11-28: qty 1

## 2019-11-28 MED ORDER — PENICILLIN V POTASSIUM 500 MG PO TABS: 500.0000 mg | ORAL_TABLET | Freq: Four times a day (QID) | ORAL | 0 refills | Status: AC

## 2019-11-28 MED ORDER — PENICILLIN V POTASSIUM 250 MG PO TABS
500.0000 mg | ORAL_TABLET | Freq: Once | ORAL | Status: AC
  Administered 2019-11-28: 500 mg via ORAL
  Filled 2019-11-28: qty 2

## 2019-11-28 MED ORDER — IOHEXOL 350 MG/ML SOLN
100.0000 mL | Freq: Once | INTRAVENOUS | Status: AC | PRN
  Administered 2019-11-28: 100 mL via INTRAVENOUS

## 2019-11-28 NOTE — ED Provider Notes (Signed)
[facility] EMERGENCY DEPARTMENT Provider Note   CSN: [id number] Arrival date & time: 11/28/19  0059     History Chief Complaint  Patient presents with  . Dental Pain    Samantha Hernandez is a 29 y.o. female.  The history is provided by the patient.  Dental Pain Location:  Upper Upper teeth location:  14/LU 1st molar, 15/LU 2nd molar and 13/LU 2nd bicuspid Quality:  Aching Severity:  Severe Onset quality:  Gradual Timing:  Constant Progression:  Unchanged Chronicity:  New Context: dental caries and enamel fracture   Previous work-up:  Dental exam Relieved by:  Nothing Worsened by:  Nothing Ineffective treatments:  None tried Associated symptoms: no congestion, no drooling, no facial swelling, no fever, no gum swelling, no neck swelling, no oral lesions and no trismus   Risk factors: no alcohol problem   Shortness of Breath Severity:  Moderate Onset quality:  Gradual Timing:  Constant Progression:  Unchanged Chronicity:  New Context: not URI   Relieved by:  Nothing Worsened by:  Nothing Ineffective treatments:  None tried Associated symptoms: no abdominal pain, no fever, no rash, no sputum production and no wheezing   Risk factors: oral contraceptive use   Risk factors: no recent alcohol use        Past Medical History:  Diagnosis Date  . Acne   . ADD (attention deficit disorder)   . Allergy   . Amenorrhea   . Anxiety   . Hormone disorder   . Insomnia   . Panic attack   . Reactive depression   . Tinnitus   . Urticaria     Patient Active Problem List   Diagnosis Date Noted  . Acne 09/29/2018  . ADD (attention deficit disorder) 07/19/2011    History reviewed. No pertinent surgical history.   OB History    Gravida  0   Para  0   Term  0   Preterm  0   AB  0   Living  0     SAB  0   TAB  0   Ectopic  0   Multiple  0   Live Births  0           Family History  Problem Relation Age of Onset  . Anxiety disorder Mother    . Depression Father   . Allergic rhinitis Father   . Anxiety disorder Sister   . Allergic rhinitis Sister   . Bipolar disorder Paternal Aunt   . Diabetes Paternal Aunt   . Bipolar disorder Cousin   . Diabetes Paternal Grandfather   . Asthma Neg Hx   . Eczema Neg Hx   . Urticaria Neg Hx     Social History   Tobacco Use  . Smoking status: Former Games developermoker  . Smokeless tobacco: Never Used  Vaping Use  . Vaping Use: Former  Substance Use Topics  . Alcohol use: No  . Drug use: No    Home Medications Prior to Admission medications   Medication Sig Start Date End Date Taking? Authorizing Provider  amphetamine-dextroamphetamine (ADDERALL) 20 MG tablet Take 20 mg by mouth daily.    [provider]  ASTAXANTHIN PO Take 12 mg by mouth daily.    [provider]  Azelaic Acid 15 % cream  09/25/17   [provider]  b complex vitamins capsule Take 1 capsule by mouth daily.    [provider]  cholecalciferol (VITAMIN D) 1000 units tablet Take 1,000  Units by mouth 2 (two) times daily.    [provider]  drospirenone-ethinyl estradiol (YAZ,GIANVI,LORYNA) 3-0.02 MG tablet Take 1 tablet by mouth daily. Start on the first day of your cycle after taking the provera. Patient not taking: Reported on 08/31/2018 09/22/17   Romualdo Bolk, MD  ibuprofen (ADVIL,MOTRIN) 800 MG tablet  07/01/17   [provider]  norethindrone-ethinyl estradiol (LOESTRIN FE) 1-20 MG-MCG tablet Take 1 tablet by mouth daily. 08/31/18   Fontaine, Nadyne Coombes, MD  Omega-3 Fatty Acids (FISH OIL) 1200 MG CAPS Take by mouth.    [provider]  Polypodium Leucotomos (HELIOCARE PO) Take by mouth.    [provider]  Zinc 50 MG CAPS Take by mouth.    [provider]    Allergies    Patient has no known allergies.  Review of Systems   Review of Systems  Constitutional: Negative for fever.  HENT: Positive for dental problem. Negative for  congestion, drooling, facial swelling and mouth sores.   Eyes: Negative for visual disturbance.  Respiratory: Positive for shortness of breath. Negative for sputum production and wheezing.   Gastrointestinal: Negative for abdominal pain.  Genitourinary: Negative for difficulty urinating.  Musculoskeletal: Negative for arthralgias.  Skin: Negative for rash.  Neurological: Negative for dizziness.  Psychiatric/Behavioral: Negative for agitation.    Physical Exam Updated Vital Signs BP (!) 126/92 (BP Location: Left Arm)   Pulse (!) 103   Temp 99.8 F (37.7 C) (Oral)   Resp 18   Ht 5\' 8"  (1.727 m)   Wt 117.9 kg   SpO2 99%   BMI 39.53 kg/m   Physical Exam Vitals and nursing note reviewed.  Constitutional:      General: She is not in acute distress.    Appearance: Normal appearance.  HENT:     Head: Normocephalic and atraumatic.     Nose: Nose normal.     Mouth/Throat:   Eyes:     Conjunctiva/sclera: Conjunctivae normal.     Pupils: Pupils are equal, round, and reactive to light.  Cardiovascular:     Rate and Rhythm: Normal rate and regular rhythm.     Pulses: Normal pulses.     Heart sounds: Normal heart sounds.  Pulmonary:     Effort: Pulmonary effort is normal.     Breath sounds: Normal breath sounds.  Abdominal:     General: Abdomen is flat. Bowel sounds are normal.     Tenderness: There is no abdominal tenderness. There is no guarding or rebound.  Musculoskeletal:        General: Normal range of motion.     Cervical back: Normal range of motion and neck supple.  Skin:    General: Skin is warm and dry.     Capillary Refill: Capillary refill takes less than 2 seconds.  Neurological:     General: No focal deficit present.     Mental Status: She is alert and oriented to person, place, and time.     Deep Tendon Reflexes: Reflexes normal.  Psychiatric:        Mood and Affect: Mood normal.        Behavior: Behavior normal.     ED Results / Procedures / Treatments    Labs (all labs ordered are listed, but only abnormal results are displayed) Results for orders placed or performed during the hospital encounter of 11/28/19  CBC with Differential/Platelet  Result Value Ref Range   WBC 8.5 4.0 - 10.5 K/uL  RBC 4.71 3.87 - 5.11 MIL/uL   Hemoglobin 14.3 12.0 - 15.0 g/dL   HCT 51.7 36 - 46 %   MCV 90.4 80.0 - 100.0 fL   MCH 30.4 26.0 - 34.0 pg   MCHC 33.6 30.0 - 36.0 g/dL   RDW 00.1 74.9 - 44.9 %   Platelets 286 150 - 400 K/uL   nRBC 0.0 0.0 - 0.2 %   Neutrophils Relative % 67 %   Neutro Abs 5.7 1.7 - 7.7 K/uL   Lymphocytes Relative 19 %   Lymphs Abs 1.6 0.7 - 4.0 K/uL   Monocytes Relative 10 %   Monocytes Absolute 0.8 0 - 1 K/uL   Eosinophils Relative 3 %   Eosinophils Absolute 0.3 0 - 0 K/uL   Basophils Relative 1 %   Basophils Absolute 0.1 0 - 0 K/uL   Immature Granulocytes 0 %   Abs Immature Granulocytes 0.03 0.00 - 0.07 K/uL  Basic metabolic panel  Result Value Ref Range   Sodium 135 135 - 145 mmol/L   Potassium 3.8 3.5 - 5.1 mmol/L   Chloride 103 98 - 111 mmol/L   CO2 23 22 - 32 mmol/L   Glucose, Bld 107 (H) 70 - 99 mg/dL   BUN 15 6 - 20 mg/dL   Creatinine, Ser 6.75 0.44 - 1.00 mg/dL   Calcium 8.9 8.9 - 91.6 mg/dL   GFR calc non Af Amer >60 >60 mL/min   GFR calc Af Amer >60 >60 mL/min   Anion gap 9 5 - 15  Pregnancy, urine  Result Value Ref Range   Preg Test, Ur NEGATIVE NEGATIVE   CT Angio Chest PE W and/or Wo Contrast  Result Date: 11/28/2019 CLINICAL DATA:  Chest pain short of breath EXAM: CT ANGIOGRAPHY CHEST WITH CONTRAST TECHNIQUE: Multidetector CT imaging of the chest was performed using the standard protocol during bolus administration of intravenous contrast. Multiplanar CT image reconstructions and MIPs were obtained to evaluate the vascular anatomy. CONTRAST:  OMNIPAQUE IOHEXOL 350 MG/ML SOLN COMPARISON:  09/30/2017 FINDINGS: Cardiovascular: Satisfactory opacification of the pulmonary arteries to the segmental  level. No evidence of pulmonary embolism. Normal heart size. No pericardial effusion. Nonaneurysmal aorta. Mediastinum/Nodes: No enlarged mediastinal, hilar, or axillary lymph nodes. Thyroid gland, trachea, and esophagus demonstrate no significant findings. Lungs/Pleura: Lungs are clear. No pleural effusion or pneumothorax. Upper Abdomen: Spleen appears enlarged at 15 cm. No acute abnormality Musculoskeletal: No chest wall abnormality. No acute or significant osseous findings. Review of the MIP images confirms the above findings. IMPRESSION: 1. Negative for acute pulmonary embolus. Clear lung fields. 2. Splenomegaly. Electronically Signed   By: Jasmine Pang M.D.   On: 11/28/2019 03:44    Radiology CT Angio Chest PE W and/or Wo Contrast  Result Date: 11/28/2019 CLINICAL DATA:  Chest pain short of breath EXAM: CT ANGIOGRAPHY CHEST WITH CONTRAST TECHNIQUE: Multidetector CT imaging of the chest was performed using the standard protocol during bolus administration of intravenous contrast. Multiplanar CT image reconstructions and MIPs were obtained to evaluate the vascular anatomy. CONTRAST:  OMNIPAQUE IOHEXOL 350 MG/ML SOLN COMPARISON:  09/30/2017 FINDINGS: Cardiovascular: Satisfactory opacification of the pulmonary arteries to the segmental level. No evidence of pulmonary embolism. Normal heart size. No pericardial effusion. Nonaneurysmal aorta. Mediastinum/Nodes: No enlarged mediastinal, hilar, or axillary lymph nodes. Thyroid gland, trachea, and esophagus demonstrate no significant findings. Lungs/Pleura: Lungs are clear. No pleural effusion or pneumothorax. Upper Abdomen: Spleen appears enlarged at 15 cm. No acute abnormality Musculoskeletal: No chest  wall abnormality. No acute or significant osseous findings. Review of the MIP images confirms the above findings. IMPRESSION: 1. Negative for acute pulmonary embolus. Clear lung fields. 2. Splenomegaly. Electronically Signed   By: Jasmine Pang M.D.   On:  11/28/2019 03:44    Procedures Procedures (including critical care time)  Medications Ordered in ED Medications  ibuprofen (ADVIL) tablet 800 mg (800 mg Oral Given 11/28/19 0259)  penicillin v potassium (VEETID) tablet 500 mg (500 mg Oral Given 11/28/19 0259)  iohexol (OMNIPAQUE) 350 MG/ML injection 100 mL (100 mLs Intravenous Contrast Given 11/28/19 0327)    ED Course  I have reviewed the triage vital signs and the nursing notes.  Pertinent labs & imaging results that were available during my care of the patient were reviewed by me and considered in my medical decision making (see chart for details).    ruled out for PE in the ED.  Will start antibiotics and naproxen for dental caries and refer to dentist.  No facial or neck swelling.    Samantha Hernandez was evaluated in Emergency Department on 11/28/2019 for the symptoms described in the history of present illness. She was evaluated in the context of the global COVID-19 pandemic, which necessitated consideration that the patient might be at risk for infection with the SARS-CoV-2 virus that causes COVID-19. Institutional protocols and algorithms that pertain to the evaluation of patients at risk for COVID-19 are in a state of rapid change based on information released by regulatory bodies including the CDC and federal and state organizations. These policies and algorithms were followed during the patient's care in the ED.  Final Clinical Impression(s) / ED Diagnoses Return for intractable cough, coughing up blood,fevers >100.4 unrelieved by medication, shortness of breath, intractable vomiting, chest pain, shortness of breath, weakness,numbness, changes in speech, facial asymmetry,abdominal pain, passing out,Inability to tolerate liquids or food, cough, altered mental status or any concerns. No signs of systemic illness or infection. The patient is nontoxic-appearing on exam and vital signs are within normal limits.   I have reviewed the  triage vital signs and the nursing notes. Pertinent labs &imaging results that were available during my care of the patient were reviewed by me and considered in my medical decision making (see chart for details).After history, exam, and medical workup I feel the patient has beenappropriately medically screened and is safe for discharge home. Pertinent diagnoses were discussed with the patient. Patient was given return precautions.   Pate Aylward, MD 11/28/19 [phone]

## 2019-11-28 NOTE — ED Triage Notes (Addendum)
Pt reports left upper tooth pain since yesterday. States she has not felt well today and sometimes she has pain in her left lower ribs when she takes a deep breath. She took tylenol just prior to arrival

## 2021-10-24 IMAGING — CT CT ANGIO CHEST
2 of 9 series · 19 of 36 positions shown · IV contrast (Omnipaque)
Comparison: 09/30/2017

CLINICAL DATA: Chest pain short of breath

EXAM:
CT ANGIOGRAPHY CHEST WITH CONTRAST
TECHNIQUE: Multidetector CT imaging of the chest was performed using the
standard protocol during bolus administration of intravenous
contrast. Multiplanar CT image reconstructions and MIPs were
obtained to evaluate the vascular anatomy.
CONTRAST:  100mL OMNIPAQUE IOHEXOL 350 MG/ML SOLN

[Series 8: pe coronal mpr · coronal · 0.55mm/px · 1 of 86 slices shown]
[im 43/86  mediastinal]
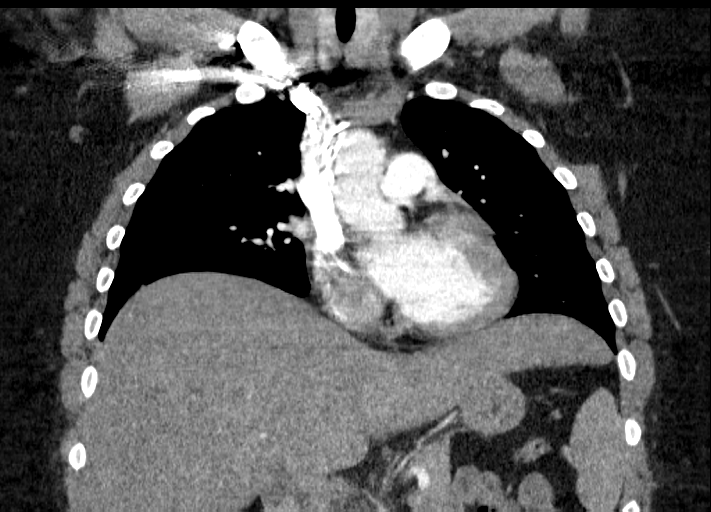

[Series 12: pe thins · axial · 0.72mm/px · z∈[+762,+1009]mm · 18 of 367 slices shown]
[im 19/367  lung]
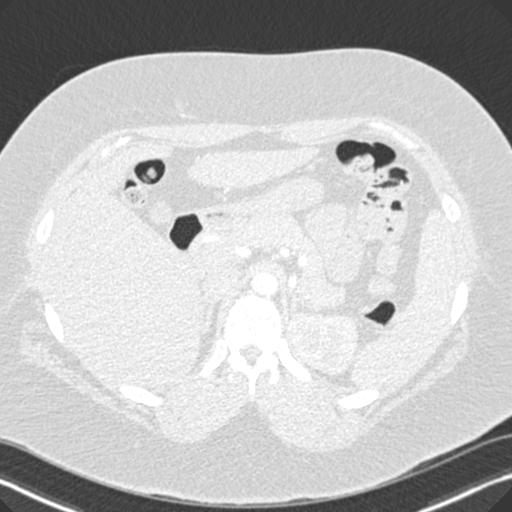
[im 37/367  mediastinal]
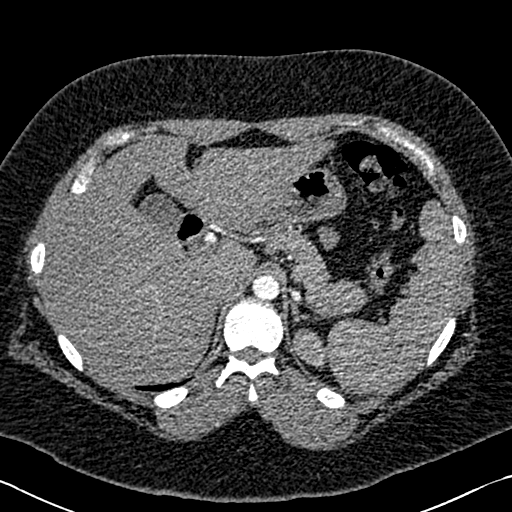
[im 55/367  lung]
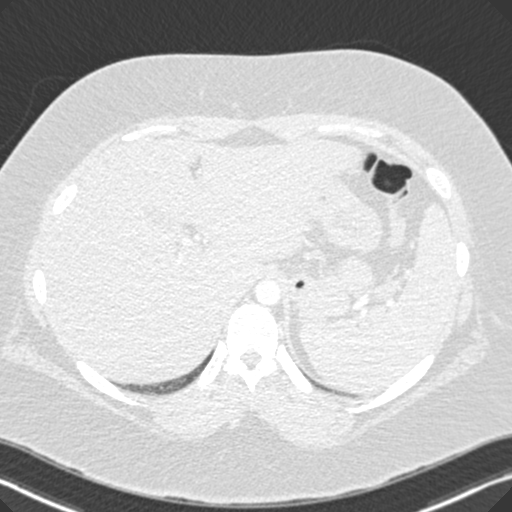
[im 74/367  mediastinal]
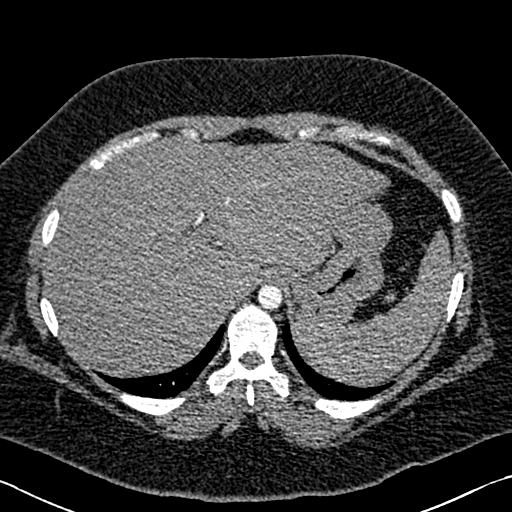
[im 92/367  lung]
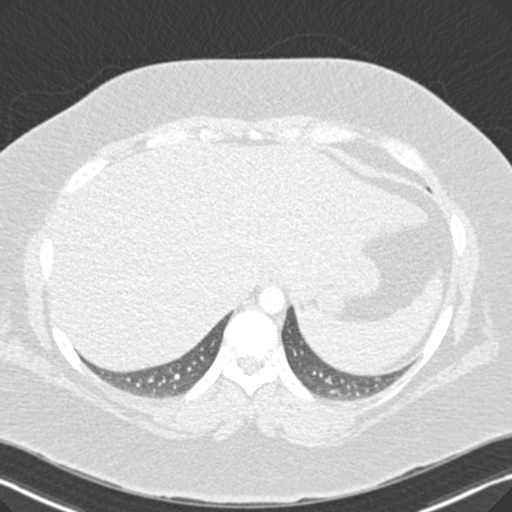
[im 110/367  mediastinal]
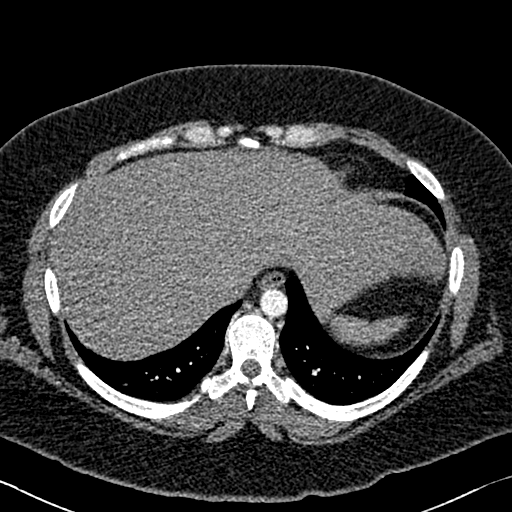
[im 129/367  lung]
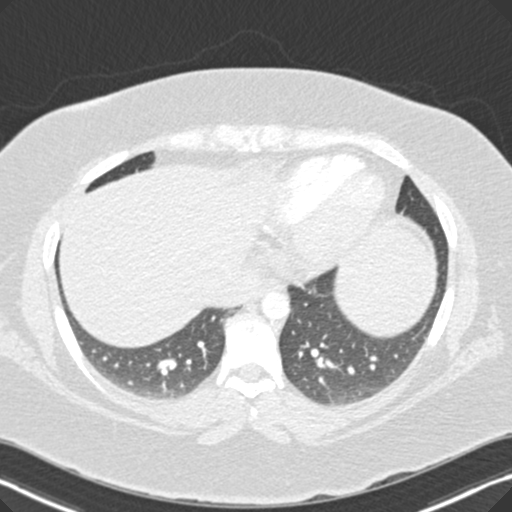
[im 147/367  mediastinal]
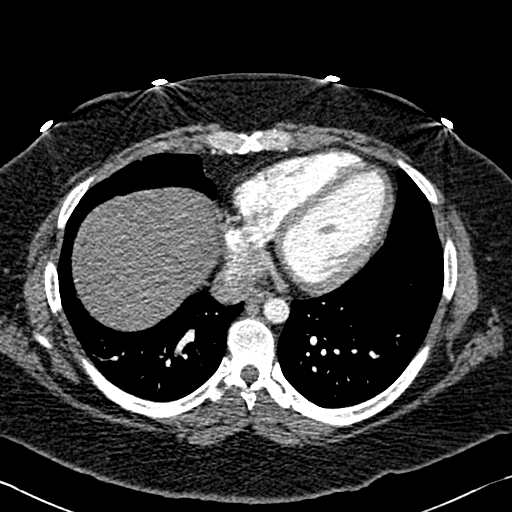
[im 165/367  lung]
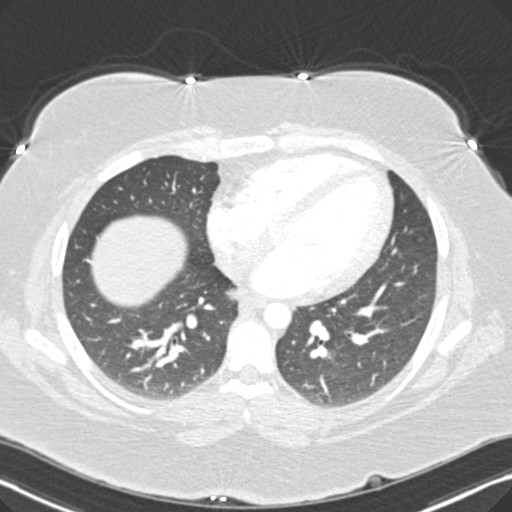
[im 202/367  mediastinal]
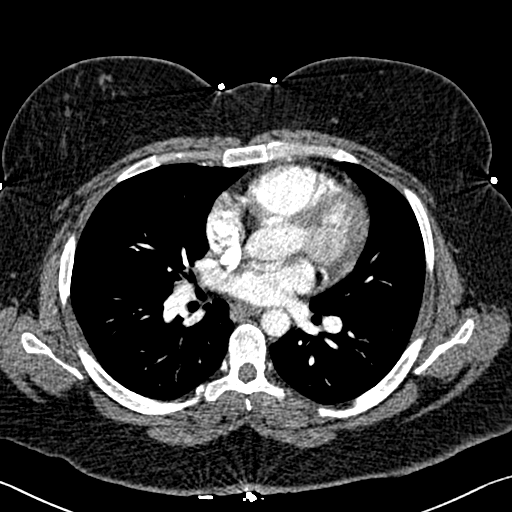
[im 220/367  lung]
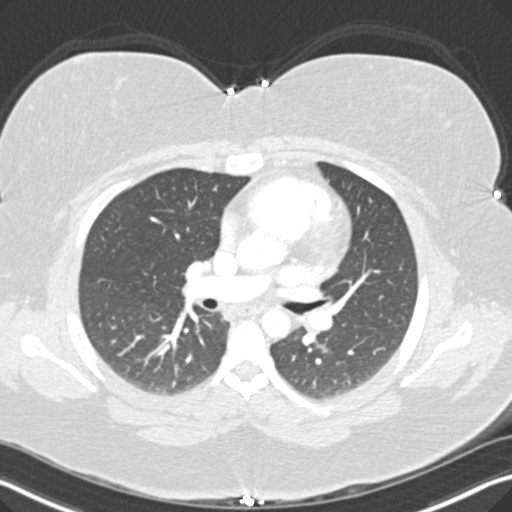
[im 238/367  mediastinal]
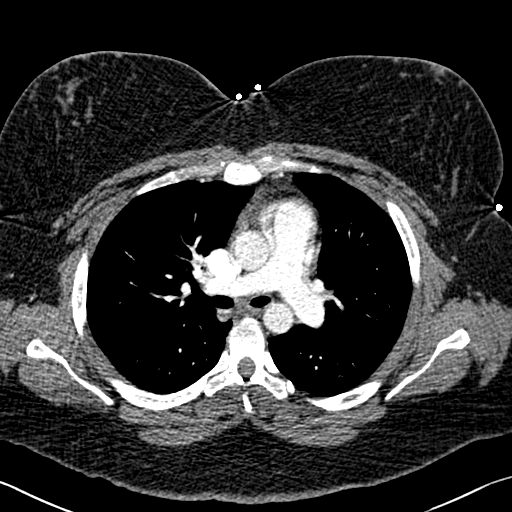
[im 257/367  lung]
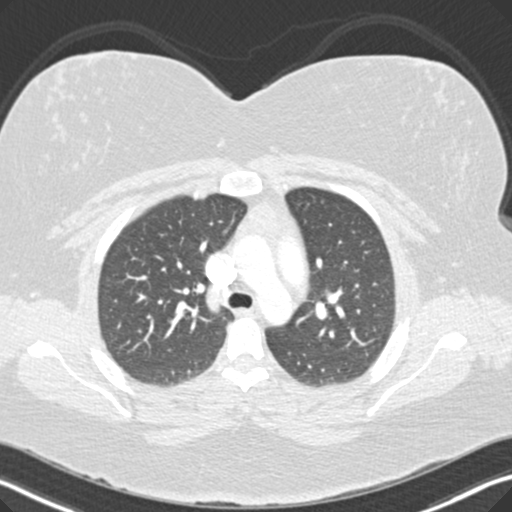
[im 275/367  mediastinal]
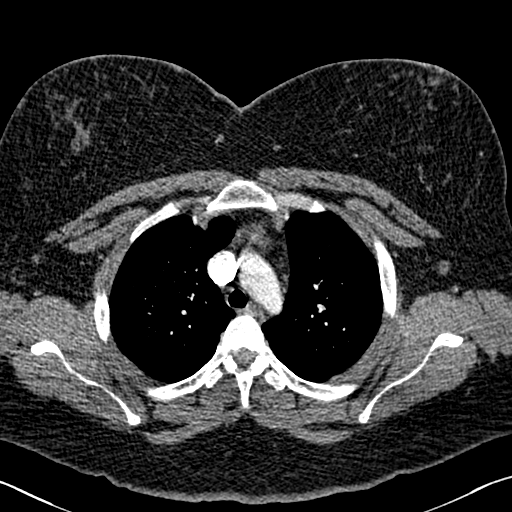
[im 293/367  lung]
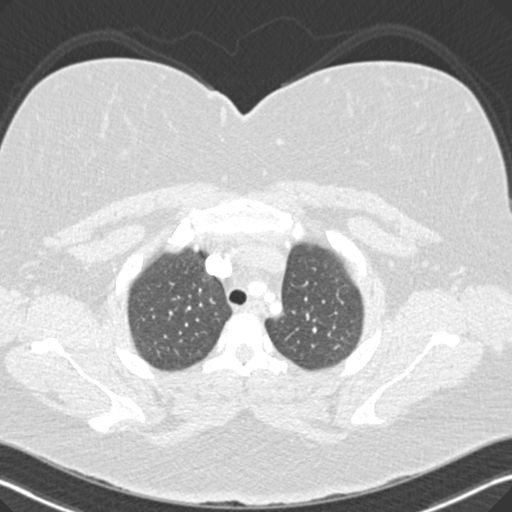
[im 312/367  mediastinal]
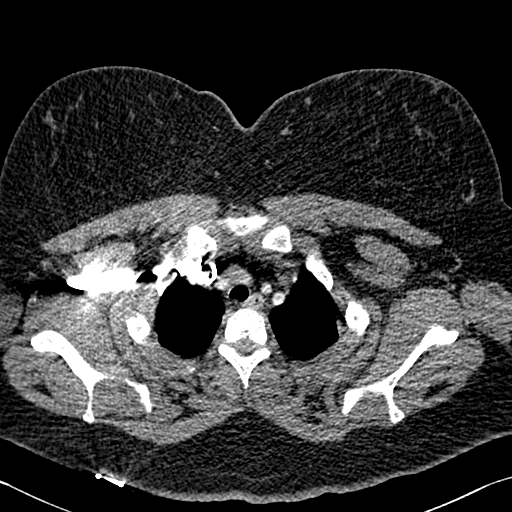
[im 330/367  lung]
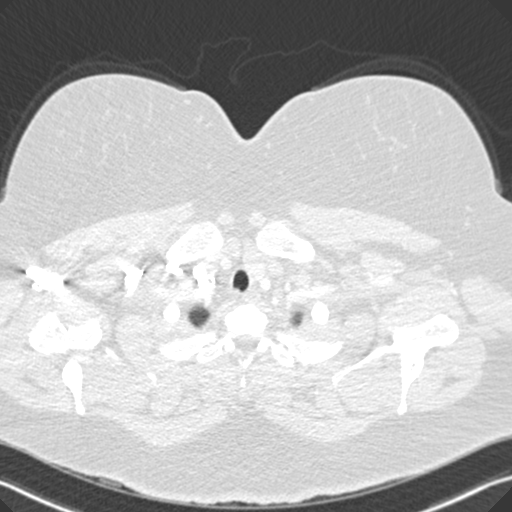
[im 348/367  mediastinal]
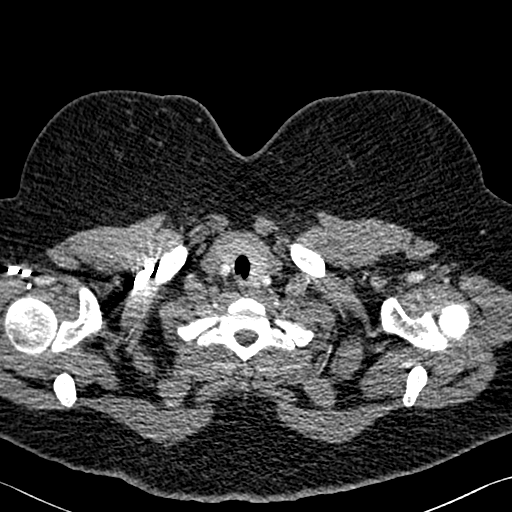

[19 of 36 positions shown; findings below may reference images not displayed]

FINDINGS: Cardiovascular: Satisfactory opacification of the pulmonary arteries
to the segmental level. No evidence of pulmonary embolism. Normal
heart size. No pericardial effusion. Nonaneurysmal aorta.

Mediastinum/Nodes: No enlarged mediastinal, hilar, or axillary lymph
nodes. Thyroid gland, trachea, and esophagus demonstrate no
significant findings.

Lungs/Pleura: Lungs are clear. No pleural effusion or pneumothorax.

Upper Abdomen: Spleen appears enlarged at 15 cm. No acute
abnormality

Musculoskeletal: No chest wall abnormality. No acute or significant
osseous findings.

Review of the MIP images confirms the above findings.
IMPRESSION: 1. Negative for acute pulmonary embolus. Clear lung fields.
2. Splenomegaly.

## 2022-07-22 ENCOUNTER — Encounter (HOSPITAL_BASED_OUTPATIENT_CLINIC_OR_DEPARTMENT_OTHER): Payer: Self-pay | Admitting: Urology

## 2022-07-22 ENCOUNTER — Other Ambulatory Visit: Payer: Self-pay

## 2022-07-22 ENCOUNTER — Emergency Department (HOSPITAL_BASED_OUTPATIENT_CLINIC_OR_DEPARTMENT_OTHER): Payer: Medicaid Other

## 2022-07-22 ENCOUNTER — Emergency Department (HOSPITAL_BASED_OUTPATIENT_CLINIC_OR_DEPARTMENT_OTHER)
Admission: EM | Admit: 2022-07-22 | Discharge: 2022-07-22 | Disposition: A | Discharge status: Home / Self Care | Payer: Medicaid Other | Attending: Emergency Medicine | Admitting: Emergency Medicine

## 2022-07-22 DIAGNOSIS — R0789 Other chest pain: Secondary | ICD-10-CM | POA: Insufficient documentation

## 2022-07-22 DIAGNOSIS — R000 Tachycardia, unspecified: Secondary | ICD-10-CM | POA: Diagnosis not present

## 2022-07-22 DIAGNOSIS — R0602 Shortness of breath: Secondary | ICD-10-CM | POA: Diagnosis not present

## 2022-07-22 DIAGNOSIS — F419 Anxiety disorder, unspecified: Secondary | ICD-10-CM | POA: Insufficient documentation

## 2022-07-22 LAB — CBC
HCT: 41.8 % (ref 36.0–46.0)
Hemoglobin: 14.5 g/dL (ref 12.0–15.0)
MCH: 30.8 pg (ref 26.0–34.0)
MCHC: 34.7 g/dL (ref 30.0–36.0)
MCV: 88.7 fL (ref 80.0–100.0)
Platelets: 320 10*3/uL (ref 150–400)
RBC: 4.71 MIL/uL (ref 3.87–5.11)
RDW: 13.1 % (ref 11.5–15.5)
WBC: 7 10*3/uL (ref 4.0–10.5)
nRBC: 0 % (ref 0.0–0.2)

## 2022-07-22 LAB — BASIC METABOLIC PANEL
Anion gap: 12 (ref 5–15)
BUN: 11 mg/dL (ref 6–20)
CO2: 22 mmol/L (ref 22–32)
Calcium: 8.7 mg/dL — ABNORMAL LOW (ref 8.9–10.3)
Chloride: 98 mmol/L (ref 98–111)
Creatinine, Ser: 1.02 mg/dL — ABNORMAL HIGH (ref 0.44–1.00)
GFR, Estimated: >60 mL/min (ref >60)
Glucose, Bld: 113 mg/dL — ABNORMAL HIGH (ref 70–99)
Potassium: 4 mmol/L (ref 3.5–5.1)
Sodium: 132 mmol/L — ABNORMAL LOW (ref 135–145)

## 2022-07-22 LAB — HCG, SERUM, QUALITATIVE: Preg, Serum: NEGATIVE

## 2022-07-22 LAB — PREGNANCY, URINE: Preg Test, Ur: NEGATIVE

## 2022-07-22 LAB — TROPONIN I (HIGH SENSITIVITY)
Troponin I (High Sensitivity): 2 ng/L (ref <18)
Troponin I (High Sensitivity): 3 ng/L (ref <18)

## 2022-07-22 LAB — D-DIMER, QUANTITATIVE: D-Dimer, Quant: <0.27 ug/mL-FEU (ref 0.00–0.50)

## 2022-07-22 MED ORDER — HYDROXYZINE HCL 25 MG PO TABS
25.0000 mg | ORAL_TABLET | Freq: Once | ORAL | Status: AC
  Administered 2022-07-22: 25 mg via ORAL
  Filled 2022-07-22: qty 1

## 2022-07-22 MED ORDER — OMEPRAZOLE 20 MG PO CPDR: 20.0000 mg | DELAYED_RELEASE_CAPSULE | Freq: Every day | ORAL | 0 refills | Status: AC

## 2022-07-22 NOTE — ED Triage Notes (Signed)
Pt states unable to take a full breath in, spo2 97% on RA Reports feeling of nausea and drinking pedialyte after drinking ETOH last night  States pain on left side under breast that radiates to the back   H/o panic attacks, pt hyperventilating at time of triage

## 2022-07-22 NOTE — ED Provider Notes (Signed)
3:25 PM Care assumed from Dr. Manus Gunning.  At time of transfer of care, patient is waiting for results of delta troponin and reassessment of chest discomfort.  If show troponin is negative, plan of care will be to discharge home.  D-dimer was negative and other workup was reassuring thus far.  4:08 PM Delta troponin negative.  Per previous plan will discharge for outpatient follow-up.  I spoke to patient and she agrees.  Patient discharged in good condition.  Clinical Impression: 1. Atypical chest pain     Disposition: Discharge  Condition: Good  I have discussed the results, Dx and Tx plan with the pt(& family if present). He/she/they expressed understanding and agree(s) with the plan. Discharge instructions discussed at great length. Strict return precautions discussed and pt &/or family have verbalized understanding of the instructions. No further questions at time of discharge.    New Prescriptions   OMEPRAZOLE (PRILOSEC) 20 MG CAPSULE    Take 1 capsule (20 mg total) by mouth daily.    Follow Up: Jettie Pagan, NP [address] [place] [address] [phone]  Schedule an appointment as soon as possible for a visit in 2 days      Joandry Slagter, Canary Brim, MD 07/22/22 [phone]

## 2022-07-22 NOTE — Discharge Instructions (Signed)
There is no evidence of heart attack or blood clot in the lung.  As we discussed, your symptoms are likely secondary to anxiety.  You should follow-up with the cardiologist for a stress test.  Discussed with your primary doctor about being started on medication for anxiety.  Reduce your alcohol intake.  Return to the ED with exertional chest pain, pain associate with shortness of breath, nausea, vomiting, sweating or other concerns.

## 2022-07-22 NOTE — ED Provider Notes (Signed)
[facility] Provider Note   CSN: [id number] Arrival date & time: 07/22/22  1257     History  Chief Complaint  Patient presents with   Shortness of Breath    Samantha Hernandez is a 32 y.o. female.  Patient presents with chest tightness, difficulty breathing, pleuritic chest pain since waking up this morning.  Does have a history of "breathing problems" but no diagnosis of asthma.  She denies taking any breathing medications.  States she drank about a 12 pack of beer last night which is not unusual for her and has had some nausea this morning.  When she woke up her chest feels tight she feels short of breath she has pleuritic chest pain on the left side worse with breathing.  Feels like she is short of breath and cannot walk around due to shortness of breath.  Pain radiates into her left breast and is worse with deep breathing.  She does smoke cigarettes.  Denies any productive cough or fever.  No vomiting.  No abdominal pain.  The history is provided by the patient.  Shortness of Breath Associated symptoms: chest pain and cough   Associated symptoms: no abdominal pain, no fever, no headaches, no rash and no vomiting        Home Medications Prior to Admission medications   Medication Sig Start Date End Date Taking? Authorizing Provider  amphetamine-dextroamphetamine (ADDERALL) 20 MG tablet Take 20 mg by mouth daily.    [provider]  ASTAXANTHIN PO Take 12 mg by mouth daily.    [provider]  Azelaic Acid 15 % cream  09/25/17   [provider]  b complex vitamins capsule Take 1 capsule by mouth daily.    [provider]  cholecalciferol (VITAMIN D) 1000 units tablet Take 1,000 Units by mouth 2 (two) times daily.    [provider]  drospirenone-ethinyl estradiol (YAZ,GIANVI,LORYNA) 3-0.02 MG tablet Take 1 tablet by mouth daily. Start on the first day of your cycle after taking the  provera. Patient not taking: Reported on 08/31/2018 09/22/17   Romualdo Bolk, MD  ibuprofen (ADVIL,MOTRIN) 800 MG tablet  07/01/17   [provider]  naproxen (NAPROSYN) 375 MG tablet Take 1 tablet (375 mg total) by mouth 2 (two) times daily with a meal. 11/28/19   Palumbo, April, MD  norethindrone-ethinyl estradiol (LOESTRIN FE) 1-20 MG-MCG tablet Take 1 tablet by mouth daily. 08/31/18   Fontaine, Nadyne Coombes, MD  Omega-3 Fatty Acids (FISH OIL) 1200 MG CAPS Take by mouth.    [provider]  penicillin v potassium (VEETID) 500 MG tablet Take 1 tablet (500 mg total) by mouth 4 (four) times daily. 11/28/19   Palumbo, April, MD  Polypodium Leucotomos (HELIOCARE PO) Take by mouth.    [provider]  Zinc 50 MG CAPS Take by mouth.    [provider]      Allergies    Patient has no known allergies.    Review of Systems   Review of Systems  Constitutional:  Negative for activity change, appetite change and fever.  HENT:  Negative for congestion.   Eyes:  Negative for visual disturbance.  Respiratory:  Positive for cough, chest tightness and shortness of breath.   Cardiovascular:  Positive for chest pain.  Gastrointestinal:  Negative for abdominal pain, nausea and vomiting.  Genitourinary:  Negative for dysuria and hematuria.  Musculoskeletal:  Positive for arthralgias and myalgias.  Skin:  Negative  for rash.  Neurological:  Positive for dizziness and light-headedness. Negative for tremors, weakness and headaches.   all other systems are negative except as noted in the HPI and PMH.    Physical Exam Updated Vital Signs BP (!) 170/80 (BP Location: Right Arm)   Pulse (!) 114   Temp 98 F (36.7 C) (Oral)   Resp (!) 28   Ht  (1.727 m)   Wt 136.1 kg   LMP 07/22/2022   SpO2 98%   BMI 45.61 kg/m  Physical Exam Vitals and nursing note reviewed.  Constitutional:      General: She is not in acute distress.    Appearance: She is well-developed. She is  obese.     Comments: Anxious appearing  HENT:     Head: Normocephalic and atraumatic.     Mouth/Throat:     Pharynx: No oropharyngeal exudate.  Eyes:     Conjunctiva/sclera: Conjunctivae normal.     Pupils: Pupils are equal, round, and reactive to light.  Neck:     Comments: No meningismus. Cardiovascular:     Rate and Rhythm: Regular rhythm. Tachycardia present.     Heart sounds: Normal heart sounds. No murmur heard.    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  No tenderness to palpation of left ribs underneath breast.  No rash. Pulmonary:     Effort: Pulmonary effort is normal. No respiratory distress.     Breath sounds: Normal breath sounds.  Abdominal:     Palpations: Abdomen is soft.     Tenderness: There is no abdominal tenderness. There is no guarding or rebound.  Musculoskeletal:        General: No tenderness. Normal range of motion.     Cervical back: Normal range of motion and neck supple.  Skin:    General: Skin is warm.  Neurological:     Mental Status: She is alert and oriented to person, place, and time.     Cranial Nerves: No cranial nerve deficit.     Motor: No abnormal muscle tone.     Coordination: Coordination normal.     Comments:  5/5 strength throughout. CN 2-12 intact.Equal grip strength.   Psychiatric:        Behavior: Behavior normal.     ED Results / Procedures / Treatments   Labs (all labs ordered are listed, but only abnormal results are displayed) Labs Reviewed  BASIC METABOLIC PANEL - Abnormal; Notable for the following components:      Result Value   Sodium 132 (*)    Glucose, Bld 113 (*)    Creatinine, Ser 1.02 (*)    Calcium 8.7 (*)    All other components within normal limits  CBC  PREGNANCY, URINE  HCG, SERUM, QUALITATIVE  D-DIMER, QUANTITATIVE (NOT AT [facility])  TROPONIN I (HIGH SENSITIVITY)  TROPONIN I (HIGH SENSITIVITY)    EKG EKG Interpretation  Date/Time:  Monday July 22 2022 13:07:29 EDT Ventricular Rate:  108 PR  Interval:  137 QRS Duration: 87 QT Interval:  347 QTC Calculation: 466 R Axis:   61 Text Interpretation: Sinus tachycardia Low voltage, precordial leads No significant change was found Confirmed by Glynn Octave [phone]) on 07/22/2022 1:09:40 PM  Radiology DG Chest 2 View  Result Date: 07/22/2022 CLINICAL DATA:  Shortness of breath and chest pain EXAM: CHEST - 2 VIEW COMPARISON:  09/30/2017 FINDINGS: The heart size and mediastinal contours are within normal limits. Both lungs are clear. The visualized skeletal structures are unremarkable. IMPRESSION: No  active cardiopulmonary disease. Electronically Signed   By: Paulina Fusi M.D.   On: 07/22/2022 13:20    Procedures Procedures    Medications Ordered in ED Medications  hydrOXYzine (ATARAX) tablet 25 mg (has no administration in time range)    ED Course/ Medical Decision Making/ A&P                             Medical Decision Making Amount and/or Complexity of Data Reviewed Labs: ordered. Decision-making details documented in ED Course. Radiology: ordered and independent interpretation performed. Decision-making details documented in ED Course. ECG/medicine tests: ordered and independent interpretation performed. Decision-making details documented in ED Course.  Risk Prescription drug management.  Chest tightness, shortness of breath, pleuritic chest pain upon waking this morning.  She is anxious, tachycardic and tachypneic.  Equal breath sounds bilaterally.  Clear lungs.  EKG is sinus tachycardia without acute ST changes. Chest x-ray get it for pneumothorax or pneumonia.  Results reviewed interpreted by me.  Will give anxiety medication and screen with D-dimer.  Troponin negative.  D-dimer negative.  Low suspicion for pulmonary embolism or aortic dissection.  Patient feels moved after hydroxyzine.  Suspect likely anxiety contributing to her palpitations, chest pain and shortness of breath.  Discussed she is low risk for  heart disease but nonzero risk. Recommend follow-up for stress test.  Awaiting second troponin at shift change.  Dissipate discharge home if reassuring.  Follow-up with PCP for stress test as well as PCP for anxiety medication.  Return to the ED with exertional chest pain, pain associate with shortness of breath, nausea, vomiting, sweating or any other concerns        Final Clinical Impression(s) / ED Diagnoses Final diagnoses:  Atypical chest pain    Rx / DC Orders ED Discharge Orders     None         Bretton Tandy, Jeannett Senior, MD 07/22/22 1513
